# Patient Record
Sex: Female | Born: 1975 | Race: Black or African American | Hispanic: No | Marital: Single | Smoking: Never smoker
Health system: Southern US, Community
[De-identification: ages and names within clinical notes are randomized; demographics above are authoritative.]

## PROBLEM LIST (undated history)

## (undated) DIAGNOSIS — Z8719 Personal history of other diseases of the digestive system: Secondary | ICD-10-CM

## (undated) DIAGNOSIS — N840 Polyp of corpus uteri: Secondary | ICD-10-CM

## (undated) DIAGNOSIS — Z86010 Personal history of colonic polyps: Secondary | ICD-10-CM

## (undated) DIAGNOSIS — J45909 Unspecified asthma, uncomplicated: Secondary | ICD-10-CM

## (undated) DIAGNOSIS — R112 Nausea with vomiting, unspecified: Secondary | ICD-10-CM

## (undated) DIAGNOSIS — I10 Essential (primary) hypertension: Secondary | ICD-10-CM

## (undated) DIAGNOSIS — D259 Leiomyoma of uterus, unspecified: Secondary | ICD-10-CM

## (undated) DIAGNOSIS — R87612 Low grade squamous intraepithelial lesion on cytologic smear of cervix (LGSIL): Secondary | ICD-10-CM

## (undated) DIAGNOSIS — D649 Anemia, unspecified: Secondary | ICD-10-CM

## (undated) DIAGNOSIS — Z9889 Other specified postprocedural states: Secondary | ICD-10-CM

## (undated) DIAGNOSIS — R12 Heartburn: Secondary | ICD-10-CM

## (undated) HISTORY — PX: LAPAROSCOPIC GASTRIC SLEEVE RESECTION: SHX5895

## (undated) HISTORY — DX: Unspecified asthma, uncomplicated: J45.909

## (undated) HISTORY — PX: GALLBLADDER SURGERY: SHX652

## (undated) HISTORY — DX: Low grade squamous intraepithelial lesion on cytologic smear of cervix (LGSIL): R87.612

## (undated) HISTORY — PX: HERNIA REPAIR: SHX51

## (undated) HISTORY — PX: COLONOSCOPY W/ POLYPECTOMY: SHX1380

---

## 2015-10-13 ENCOUNTER — Ambulatory Visit (INDEPENDENT_AMBULATORY_CARE_PROVIDER_SITE_OTHER): Payer: BC Managed Care – PPO | Admitting: Internal Medicine

## 2015-10-13 VITALS — BP 136/80 | HR 82 | Temp 98.0°F | Resp 15 | Ht 65.0 in | Wt 140.0 lb

## 2015-10-13 DIAGNOSIS — T8332XA Displacement of intrauterine contraceptive device, initial encounter: Secondary | ICD-10-CM

## 2015-10-13 DIAGNOSIS — N939 Abnormal uterine and vaginal bleeding, unspecified: Secondary | ICD-10-CM

## 2015-10-13 DIAGNOSIS — T8389XA Other specified complication of genitourinary prosthetic devices, implants and grafts, initial encounter: Secondary | ICD-10-CM | POA: Diagnosis not present

## 2015-10-13 LAB — POCT WET + KOH PREP
TRICH BY WET PREP: ABSENT
YEAST BY KOH: ABSENT
YEAST BY WET PREP: ABSENT

## 2015-10-13 NOTE — Progress Notes (Signed)
Subjective:  This chart was scribed for Tami Lin, MD by Thea Alken, ED Scribe. This patient was seen in room 1 and the patient's care was started at 11:20 AM.   Patient ID: Kathryn Frye, female    DOB: [birth year], 39 y.o.   MRN: [id number]  HPI   Chief Complaint  Patient presents with   Vaginal Bleeding   HPI Comments: Kathryn Frye is a 39 y.o. female who presents to the [facility] complaining of vaginal bleeding. Pt noticed vaginal bleeding after intercourse last night. States she had a similar occurrence 2 weeks ago but states at that time she had just finished menstruating. She has been with current partner for a couple months but states they've just started having intercourse. LMP- 11/11. She has an IUD for ?8-10 years and reports it is almost time for it to be removed. She denies dyspareunia. .   Past Medical History  Diagnosis Date   Asthma    Not on File Prior to Admission medications   Not on File    Review of Systems  Constitutional: Negative for fever and chills.  Gastrointestinal: Negative for abdominal pain.  Genitourinary: Positive for vaginal bleeding. Negative for dysuria and dyspareunia.       Objective:   Physical Exam  Constitutional: She is oriented to person, place, and time. She appears well-developed and well-nourished. No distress.  HENT:  Head: Normocephalic and atraumatic.  Eyes: Conjunctivae and EOM are normal.  Neck: Neck supple.  Cardiovascular: Normal rate.   Pulmonary/Chest: Effort normal.  Genitourinary:  intr clear Oc yellow d/c-no IUD string--Friable with q-tip Manip NT No LQ tenderness  Musculoskeletal: Normal range of motion.  Neurological: She is alert and oriented to person, place, and time.  Skin: Skin is warm and dry.  Psychiatric: She has a normal mood and affect. Her behavior is normal.  Nursing note and vitals reviewed.  Filed Vitals:   10/13/15 1042  BP: 136/80  Pulse: 82    Temp: 98 F (36.7 C)  TempSrc: Oral  Resp: 15  Height: 5\' 5"  (1.651 m)  Weight: 140 lb (63.504 kg)  SpO2: 98%   Results for orders placed or performed in visit on 10/13/15  POCT Wet + KOH Prep  Result Value Ref Range   Yeast by KOH Absent Present, Absent   Yeast by wet prep Absent Present, Absent   WBC by wet prep Many (A) None, Few, Too numerous to count   Clue Cells Wet Prep HPF POC None None, Too numerous to count   Trich by wet prep Absent Present, Absent   Bacteria Wet Prep HPF POC Moderate (A) None, Few, Too numerous to count   Epithelial Cells By [employer] Pref ([facility]) Moderate (A) None, Few, Too numerous to count   RBC,UR,HPF,POC None None RBC/hpf   Assessment & Plan:   1. Vaginal bleeding   2. Malpositioned IUD, initial encounter [facility])     Orders Placed This Encounter  Procedures   GC/Chlamydia Probe Amp   Ambulatory referral to Gynecology    Referral Priority:  Routine    Referral Type:  Consultation    Referral Reason:  Specialty Services Required    Referred to Provider:  Terrance Mass, MD    Requested Specialty:  Gynecology    Number of Visits Requested:  1   POCT Wet + KOH Prep    By signing my name below, I, Raven Small, attest that this documentation has been prepared under the  direction and in the presence of Tami Lin, MD.  Electronically Signed: Thea Alken, ED Scribe. 10/13/2015. 11:33 AM.  I have completed the patient encounter in its entirety as documented by the scribe, with editing by me where necessary. Robert P. Laney Pastor, M.D.

## 2015-10-16 LAB — GC/CHLAMYDIA PROBE AMP
CT PROBE, AMP APTIMA: NEGATIVE
GC Probe RNA: NEGATIVE

## 2015-11-16 ENCOUNTER — Ambulatory Visit: Payer: BC Managed Care – PPO | Admitting: Women's Health

## 2015-12-06 ENCOUNTER — Encounter: Payer: Self-pay | Admitting: Women's Health

## 2015-12-06 ENCOUNTER — Other Ambulatory Visit (HOSPITAL_COMMUNITY)
Admission: RE | Admit: 2015-12-06 | Discharge: 2015-12-06 | Disposition: A | Discharge status: Home / Self Care | Payer: BC Managed Care – PPO | Source: Ambulatory Visit | Attending: Gynecology | Admitting: Gynecology

## 2015-12-06 ENCOUNTER — Ambulatory Visit (INDEPENDENT_AMBULATORY_CARE_PROVIDER_SITE_OTHER): Payer: BC Managed Care – PPO | Admitting: Women's Health

## 2015-12-06 ENCOUNTER — Other Ambulatory Visit: Payer: Self-pay

## 2015-12-06 VITALS — BP 128/78 | Ht 63.0 in | Wt 145.0 lb

## 2015-12-06 DIAGNOSIS — N920 Excessive and frequent menstruation with regular cycle: Secondary | ICD-10-CM | POA: Diagnosis not present

## 2015-12-06 DIAGNOSIS — Z13220 Encounter for screening for lipoid disorders: Secondary | ICD-10-CM

## 2015-12-06 DIAGNOSIS — Z01419 Encounter for gynecological examination (general) (routine) without abnormal findings: Secondary | ICD-10-CM | POA: Insufficient documentation

## 2015-12-06 DIAGNOSIS — Z113 Encounter for screening for infections with a predominantly sexual mode of transmission: Secondary | ICD-10-CM | POA: Diagnosis not present

## 2015-12-06 DIAGNOSIS — N760 Acute vaginitis: Secondary | ICD-10-CM | POA: Diagnosis not present

## 2015-12-06 DIAGNOSIS — A499 Bacterial infection, unspecified: Secondary | ICD-10-CM

## 2015-12-06 DIAGNOSIS — Z9884 Bariatric surgery status: Secondary | ICD-10-CM | POA: Diagnosis not present

## 2015-12-06 DIAGNOSIS — B9689 Other specified bacterial agents as the cause of diseases classified elsewhere: Secondary | ICD-10-CM

## 2015-12-06 DIAGNOSIS — Z1231 Encounter for screening mammogram for malignant neoplasm of breast: Secondary | ICD-10-CM

## 2015-12-06 LAB — CBC WITH DIFFERENTIAL/PLATELET
BASOS ABS: 0.1 10*3/uL (ref 0.0–0.1)
BASOS PCT: 1 % (ref 0–1)
EOS ABS: 0.1 10*3/uL (ref 0.0–0.7)
EOS PCT: 1 % (ref 0–5)
HEMATOCRIT: 35.6 % — AB (ref 36.0–46.0)
Hemoglobin: 11.7 g/dL — ABNORMAL LOW (ref 12.0–15.0)
LYMPHS PCT: 26 % (ref 12–46)
Lymphs Abs: 2.1 10*3/uL (ref 0.7–4.0)
MCH: 32.1 pg (ref 26.0–34.0)
MCHC: 32.9 g/dL (ref 30.0–36.0)
MCV: 97.8 fL (ref 78.0–100.0)
MONO ABS: 0.7 10*3/uL (ref 0.1–1.0)
MPV: 10.3 fL (ref 8.6–12.4)
Monocytes Relative: 9 % (ref 3–12)
Neutro Abs: 5.2 10*3/uL (ref 1.7–7.7)
Neutrophils Relative %: 63 % (ref 43–77)
PLATELETS: 418 10*3/uL — AB (ref 150–400)
RBC: 3.64 MIL/uL — AB (ref 3.87–5.11)
RDW: 13.2 % (ref 11.5–15.5)
WBC: 8.2 10*3/uL (ref 4.0–10.5)

## 2015-12-06 LAB — COMPREHENSIVE METABOLIC PANEL
ALK PHOS: 76 U/L (ref 33–115)
ALT: 9 U/L (ref 6–29)
AST: 16 U/L (ref 10–30)
Albumin: 3.9 g/dL (ref 3.6–5.1)
BILIRUBIN TOTAL: 0.4 mg/dL (ref 0.2–1.2)
BUN: 15 mg/dL (ref 7–25)
CALCIUM: 9.2 mg/dL (ref 8.6–10.2)
CO2: 25 mmol/L (ref 20–31)
Chloride: 100 mmol/L (ref 98–110)
Creat: 0.82 mg/dL (ref 0.50–1.10)
GLUCOSE: 71 mg/dL (ref 65–99)
Potassium: 4.2 mmol/L (ref 3.5–5.3)
SODIUM: 135 mmol/L (ref 135–146)
Total Protein: 7.1 g/dL (ref 6.1–8.1)

## 2015-12-06 LAB — LIPID PANEL
Cholesterol: 162 mg/dL (ref 125–200)
HDL: 82 mg/dL (ref >=46)
LDL CALC: 70 mg/dL (ref <130)
Total CHOL/HDL Ratio: 2 Ratio (ref <=5.0)
Triglycerides: 51 mg/dL (ref <150)
VLDL: 10 mg/dL (ref <30)

## 2015-12-06 LAB — WET PREP FOR TRICH, YEAST, CLUE
Trich, Wet Prep: NONE SEEN
WBC, Wet Prep HPF POC: NONE SEEN
YEAST WET PREP: NONE SEEN

## 2015-12-06 MED ORDER — METRONIDAZOLE 500 MG PO TABS: 500.0000 mg | ORAL_TABLET | Freq: Two times a day (BID) | ORAL | Status: DC

## 2015-12-06 NOTE — Patient Instructions (Addendum)
HPV (Human Papillomavirus) Vaccine--Gardasil-9:  1. Why get vaccinated? Gardasil-9 prevents human papillomavirus (HPV) types that cause many cancers, including:  cervical cancer in females,  vaginal and vulvar cancers in females,  anal cancer in females and males,  throat cancer in females and males, and  penile cancer in males. In addition, Gardasil-9 prevents HPV types that cause genital warts in both females and males. In the [place] about 12,000 women get cervical cancer every year, and about 4,000 women die from it. Gardasil-9 can prevent most of these cases of cervical cancer. Vaccination is not a substitute for cervical cancer screening. This vaccine does not protect against all HPV types that can cause cervical cancer. Women should still get regular Pap tests. HPV infection usually comes from sexual contact, and most people will become infected at some point in their life. About 14 million Americans, including teens, get infected every year. Most infections will go away and not cause serious problems. But thousands of women and men get cancer and diseases from HPV. 2. HPV vaccine Gardasil-9 is an FDA-approved HPV vaccine. It is recommended for both males and females. It is routinely given at 11 or 42 years of age, but it may be given beginning at age 9 years through age 26 years. Three doses of Gardasil-9 are recommended with the second dose given 1-2 months after the first dose and the third dose given 6 months after the first dose. 3. Some people should not get this vaccine  Anyone who has had a severe, life-threatening allergic reaction to a dose of HPV vaccine should not get another dose.  Anyone who has a severe (life threatening) allergy to any component of HPV vaccine should not get the vaccine. Tell your doctor if you have any severe allergies that you know of, including a severe allergy to yeast.  HPV vaccine is not recommended for pregnant women. If you learn that you were  pregnant when you were vaccinated, there is no reason to expect any problems for you or your baby. Any woman who learns she was pregnant when she got Gardasil-9 vaccine is encouraged to contact the manufacturer's registry for HPV vaccination during pregnancy at 1-800-986-8999. Women who are breastfeeding may be vaccinated.  If you have a mild illness, such as a cold, you can probably get the vaccine today. If you are moderately or severely ill, you should probably wait until you recover. Your doctor can advise you. 4. Risks of a vaccine reaction With any medicine, including vaccines, there is a chance of side effects. These are usually mild and go away on their own, but serious reactions are also possible. Most people who get HPV vaccine do not have any serious problems with it. Mild or moderate problems following Gardasil-9:  Reactions in the arm where the shot was given:  Soreness (about 9 people in 10)  Redness or swelling (about 1 person in 3)  Fever:  Mild (100F) (about 1 person in 10)  Moderate (102F) (about 1 person in 65)  Other problems:  Headache (about 1 person in 3) Problems that could happen after any injected vaccine:  People sometimes faint after a medical procedure, including vaccination. Sitting or lying down for about 15 minutes can help prevent fainting, and injuries caused by a fall. Tell your doctor if you feel dizzy, or have vision changes or ringing in the ears.  Some people get severe pain in the shoulder and have difficulty moving the arm where a shot was given. This happens   very rarely.  Any medication can cause a severe allergic reaction. Such reactions from a vaccine are very rare, estimated at about 1 in a million doses, and would happen within a few minutes to a few hours after the vaccination. As with any medicine, there is a very remote chance of a vaccine causing a serious injury or death. The safety of vaccines is always being monitored. For more  information, visit: http://www.aguilar.org/. 5. What if there is a serious reaction? What should I look for? Look for anything that concerns you, such as signs of a severe allergic reaction, very high fever, or unusual behavior. Signs of a severe allergic reaction can include hives, swelling of the face and throat, difficulty breathing, a fast heartbeat, dizziness, and weakness. These would usually start a few minutes to a few hours after the vaccination. What should I do? If you think it is a severe allergic reaction or other emergency that can't wait, call 9-1-1 or get to the nearest hospital. Otherwise, call your doctor. Afterward, the reaction should be reported to the "Vaccine Adverse Event Reporting System" (VAERS). Your doctor might file this report, or you can do it yourself through the VAERS web site at www.vaers.SamedayNews.es, or by calling 463 128 4199. VAERS does not give medical advice. 6. The National Vaccine Injury Compensation Program The [employer] Vaccine Injury Compensation Program (VICP) is a federal program that was created to compensate people who may have been injured by certain vaccines. Persons who believe they may have been injured by a vaccine can learn about the program and about filing a claim by calling 5632428544 or visiting the [place] website at GoldCloset.com.ee. There is a time limit to file a claim for compensation. 7. How can I learn more?  Ask your health care provider. He or she can give you the vaccine package insert or suggest other sources of information.  Call your local or state health department.  Contact the Centers for Disease Control and Prevention (CDC):  Call 8630385372 (1-800-CDC-INFO) or  Visit CDC's website at http://sweeney-todd.com/ Vaccine Information Statement HPV Vaccine Gaylyn Lambert) 02/15/15   This information is not intended to replace advice given to you by your health care provider. Make sure you discuss any questions you  have with your health care provider.   Document Released: 05/31/2014 Document Revised: 03/20/2015 Document Reviewed: 05/31/2014 Elsevier Interactive Patient Education 2016 Elsevier Inc. Bacterial Vaginosis Bacterial vaginosis is a vaginal infection that occurs when the normal balance of bacteria in the vagina is disrupted. It results from an overgrowth of certain bacteria. This is the most common vaginal infection in women of childbearing age. Treatment is important to prevent complications, especially in pregnant women, as it can cause a premature delivery. CAUSES  Bacterial vaginosis is caused by an increase in harmful bacteria that are normally present in smaller amounts in the vagina. Several different kinds of bacteria can cause bacterial vaginosis. However, the reason that the condition develops is not fully understood. RISK FACTORS Certain activities or behaviors can put you at an increased risk of developing bacterial vaginosis, including:  Having a new sex partner or multiple sex partners.  Douching.  Using an intrauterine device (IUD) for contraception. Women do not get bacterial vaginosis from toilet seats, bedding, swimming pools, or contact with objects around them. SIGNS AND SYMPTOMS  Some women with bacterial vaginosis have no signs or symptoms. Common symptoms include:  Grey vaginal discharge.  A fishlike odor with discharge, especially after sexual intercourse.  Itching or burning of the vagina  and vulva.  Burning or pain with urination. DIAGNOSIS  Your health care provider will take a medical history and examine the vagina for signs of bacterial vaginosis. A sample of vaginal fluid may be taken. Your health care provider will look at this sample under a microscope to check for bacteria and abnormal cells. A vaginal pH test may also be done.  TREATMENT  Bacterial vaginosis may be treated with antibiotic medicines. These may be given in the form of a pill or a vaginal  cream. A second round of antibiotics may be prescribed if the condition comes back after treatment. Because bacterial vaginosis increases your risk for sexually transmitted diseases, getting treated can help reduce your risk for chlamydia, gonorrhea, HIV, and herpes. HOME CARE INSTRUCTIONS   Only take over-the-counter or prescription medicines as directed by your health care provider.  If antibiotic medicine was prescribed, take it as directed. Make sure you finish it even if you start to feel better.  Tell all sexual partners that you have a vaginal infection. They should see their health care provider and be treated if they have problems, such as a mild rash or itching.  During treatment, it is important that you follow these instructions:  Avoid sexual activity or use condoms correctly.  Do not douche.  Avoid alcohol as directed by your health care provider.  Avoid breastfeeding as directed by your health care provider. SEEK MEDICAL CARE IF:   Your symptoms are not improving after 3 days of treatment.  You have increased discharge or pain.  You have a fever. MAKE SURE YOU:   Understand these instructions.  Will watch your condition.  Will get help right away if you are not doing well or get worse. FOR MORE INFORMATION  Centers for Disease Control and Prevention, Division of STD Prevention: AppraiserFraud.fi American Sexual Health Association (ASHA): www.ashastd.org    This information is not intended to replace advice given to you by your health care provider. Make sure you discuss any questions you have with your health care provider.   Document Released: 11/03/2005 Document Revised: 11/24/2014 Document Reviewed: 06/15/2013 Elsevier Interactive Patient Education [employer].

## 2015-12-06 NOTE — Progress Notes (Signed)
Kathryn Frye 01-26-1976 [id number]    History:    Presents for annual exam.  Monthly 7 - 9 day cycle with ParaGard IUD placed  8-10 years in [place]. Normal Pap history. Has not had a mammogram. Negative GC/chlamydia 09/2015 at primary care same partner. Was seen at primary care in November for irregular bleeding with intercourse.  Past medical history, past surgical history, family history and social history were all reviewed and documented in the EPIC chart. First grade teacher. Has a 40 year old son and 3 adopted children ages 40-12. 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 History of cholecystectomy and abdominal hernia repair  ROS:  A ROS was performed and pertinent positives and negatives are included.  Exam:  Filed Vitals:   12/06/15 1215  BP: 128/78    General appearance:  Normal Thyroid:  Symmetrical, normal in size, without palpable masses or nodularity. Respiratory  Auscultation:  Clear without wheezing or rhonchi Cardiovascular  Auscultation:  Regular rate, without rubs, murmurs or gallops  Edema/varicosities:  Not grossly evident Abdominal  Soft,nontender, without masses, guarding or rebound.  Liver/spleen:  No organomegaly noted  Hernia:  None appreciated  Skin  Inspection:  Grossly normal   Breasts: Examined lying and sitting.     Right: Without masses, retractions, discharge or axillary adenopathy.     Left: Without masses, retractions, discharge or axillary adenopathy. Gentitourinary   Inguinal/mons:  Normal without inguinal adenopathy  External genitalia:  Normal  BUS/Urethra/Skene's glands:  Normal  Vagina:  Normal cervix friable, wet prep positive for moderate amount of clues, TNTC bacteria, positive odor  Cervix:  Normal IUD strings not visible  Uterus:  normal in size, shape and contour.  Midline and mobile  Adnexa/parametria:     Rt: Without masses or tenderness.   Lt: Without masses or tenderness.  Anus and  perineum: Normal  Digital rectal exam: Normal sphincter tone without palpated masses or tenderness  Assessment/Plan:  40 y.o.  SBF G1 P1 for annual exam.     Monthly 7- 9 day cycle ParaGard IUD due to be removed Contraception management Bacteria vaginosis  Plan: Flagyl 500 twice daily for 7 days, alcohol precautions reviewed. Contraception options reviewed, Mirena IUD information given and reviewed slight risk for infection, perforation, hemorrhage. Will check coverage, Dr. Phineas Real to place with next cycle. SBE's, reviewed importance of annual screening mammogram, instructed to schedule, breast center information given. Exercise, calcium rich diet, vitamin D 1000 daily encouraged. CBC, CMP, lipid panel, TSH, UA, Pap with HR HPV typing, HIV, hep B, C, RPR.    Kathryn Frye J WHNP, 1:40 PM 12/06/2015

## 2015-12-07 LAB — HIV ANTIBODY (ROUTINE TESTING W REFLEX): HIV: NONREACTIVE

## 2015-12-07 LAB — TSH: TSH: 0.958 u[IU]/mL (ref 0.350–4.500)

## 2015-12-07 LAB — URINALYSIS W MICROSCOPIC + REFLEX CULTURE
Bilirubin Urine: NEGATIVE
CASTS: NONE SEEN [LPF]
Crystals: NONE SEEN [HPF]
Glucose, UA: NEGATIVE
Ketones, ur: NEGATIVE
Leukocytes, UA: NEGATIVE
NITRITE: NEGATIVE
PROTEIN: NEGATIVE
Specific Gravity, Urine: 1.023 (ref 1.001–1.035)
YEAST: NONE SEEN [HPF]
pH: 6 (ref 5.0–8.0)

## 2015-12-07 LAB — RPR

## 2015-12-07 LAB — HEPATITIS C ANTIBODY: HCV Ab: NEGATIVE

## 2015-12-07 LAB — HEPATITIS B SURFACE ANTIGEN: HEP B S AG: NEGATIVE

## 2015-12-09 LAB — URINE CULTURE: Colony Count: >=100000

## 2015-12-10 ENCOUNTER — Other Ambulatory Visit: Payer: Self-pay | Admitting: Women's Health

## 2015-12-10 MED ORDER — SULFAMETHOXAZOLE-TRIMETHOPRIM 800-160 MG PO TABS: 1.0000 | ORAL_TABLET | Freq: Two times a day (BID) | ORAL | Status: DC

## 2015-12-11 ENCOUNTER — Telehealth: Payer: Self-pay | Admitting: Gynecology

## 2015-12-11 LAB — CYTOLOGY - PAP

## 2015-12-11 NOTE — Telephone Encounter (Signed)
12/11/15-I spoke w/pt today to let her know that her [facility] will cover the removal of paraguard iud and insertion of new Mirena iud for contraception at 100%, no copay. She will call for appt with next cycle. This is [place] pt. Pr [id number]--Ref#[id number]

## 2015-12-13 ENCOUNTER — Encounter: Payer: Self-pay | Admitting: Gynecology

## 2015-12-13 ENCOUNTER — Ambulatory Visit (INDEPENDENT_AMBULATORY_CARE_PROVIDER_SITE_OTHER): Payer: BC Managed Care – PPO | Admitting: Gynecology

## 2015-12-13 VITALS — BP 116/74

## 2015-12-13 DIAGNOSIS — Z30433 Encounter for removal and reinsertion of intrauterine contraceptive device: Secondary | ICD-10-CM | POA: Diagnosis not present

## 2015-12-13 NOTE — Progress Notes (Signed)
Patient presents for ParaGard IUD removal and Mirena IUD replacement. She has read through the booklet, has no contraindications and signed the consent form.  She is having heavy menses with the ParaGard and wants to try the Mirena.  At the last 2 pelvic exams her IUD strings were not visible.  She currently is on a normal menses.  I reviewed the removal and insertional process with her as well as the risks to include infection, either immediate or long-term, uterine perforation or migration requiring surgery to remove, other complications such as pain, hormonal side effects, infertility and possibility of failure with subsequent pregnancy.   Exam with [name] assistant Pelvic: External BUS vagina normal. Cervix normal with moderate menses flow. IUD strings not visualized. Uterus anteverted normal size shape contour midline mobile nontender. Adnexa without masses or tenderness.  Procedure: The cervix was visualized with the speculum and the cervical os was initially probed with a [place] forcep and no IUD strings were visualized or grasped. Using an IUD hook inserted into the uterine cavity I could feel the IUD but was unable to retrieve it in 2 passes. I subsequently used the [facility] forcep, advanced into the lower uterine segment and was able to grab the ParaGard IUD at the lower aspect and remove the IUD intact. Of note there were no strings attached to the IUD.  I passed the [facility] forcep back into the uterine cavity opening and closing it several times noting no retrieval of any strings.  The old ParaGard was shown to the patient and discarded.  The cervix was subsequently cleansed with Betadine, anterior lip grasped with a single-tooth tenaculum, the uterus was sounded and a Mirena IUD was placed according to manufacturer's recommendations without difficulty. The strings were trimmed.  I questioned her as far as when the last time anybody saw the strings and she had not been examined for several  years and does not remember anybody talking about the strings in the past. I reviewed with her that I never grabbed onto the strings to have pulled them loose and when I retrieve the IUD I grasped it at the lower pole away from the string insertion. My only speculation was either they spontaneously came loose and extruded or possibly someone else had tried to remove the IUD at some point and the strings broke.  I reviewed with the patient whether at this point anything further needed to be done and I think given her situation with a new IUD that really nothing further needs to be done. Childbearing is no longer an issue as she desires no further pregnancies.  The patient will follow up in one month for a postinsertional check.     Lot number:  [id number]    Anastasio Auerbach MD, 4:30 PM 12/13/2015

## 2015-12-13 NOTE — Patient Instructions (Signed)
Follow up in one month for IUD check.  Intrauterine Device Insertion Most often, an intrauterine device (IUD) is inserted into the uterus to prevent pregnancy. There are 2 types of IUDs available:  Copper IUD--This type of IUD creates an environment that is not favorable to sperm survival. The mechanism of action of the copper IUD is not known for certain. It can stay in place for 10 years.  Hormone IUD--This type of IUD contains the hormone progestin (synthetic progesterone). The progestin thickens the cervical mucus and prevents sperm from entering the uterus, and it also thins the uterine lining. There is no evidence that the hormone IUD prevents implantation. One hormone IUD can stay in place for up to 5 years, and a different hormone IUD can stay in place for up to 3 years. An IUD is the most cost-effective birth control if left in place for the full duration. It may be removed at any time. LET [facility] CARE PROVIDER KNOW ABOUT:  Any allergies you have.  All medicines you are taking, including vitamins, herbs, eye drops, creams, and over-the-counter medicines.  Previous problems you or members of your family have had with the use of anesthetics.  Any blood disorders you have.  Previous surgeries you have had.  Possibility of pregnancy.  Medical conditions you have. RISKS AND COMPLICATIONS  Generally, intrauterine device insertion is a safe procedure. However, as with any procedure, complications can occur. Possible complications include:  Accidental puncture (perforation) of the uterus.  Accidental placement of the IUD either in the muscle layer of the uterus (myometrium) or outside the uterus. If this happens, the IUD can be found essentially floating around the bowels and must be taken out surgically.  The IUD may fall out of the uterus (expulsion). This is more common in women who have recently had a child.   Pregnancy in the fallopian tube (ectopic).  Pelvic  inflammatory disease (PID), which is infection of the uterus and fallopian tubes. The risk of PID is slightly increased in the first 20 days after the IUD is placed, but the overall risk is still very low. BEFORE THE PROCEDURE  Schedule the IUD insertion for when you will have your menstrual period or right after, to make sure you are not pregnant. Placement of the IUD is better tolerated shortly after a menstrual cycle.  You may need to take tests or be examined to make sure you are not pregnant.  You may be required to take a pregnancy test.  You may be required to get checked for sexually transmitted infections (STIs) prior to placement. Placing an IUD in someone who has an infection can make the infection worse.  You may be given a pain reliever to take 1 or 2 hours before the procedure.  An exam will be performed to determine the size and position of your uterus.  Ask your health care provider about changing or stopping your regular medicines. PROCEDURE   A tool (speculum) is placed in the vagina. This allows your health care provider to see the lower part of the uterus (cervix).  The cervix is prepped with a medicine that lowers the risk of infection.  You may be given a medicine to numb each side of the cervix (intracervical or paracervical block). This is used to block and control any discomfort with insertion.  A tool (uterine sound) is inserted into the uterus to determine the length of the uterine cavity and the direction the uterus may be tilted.  A  slim instrument (IUD inserter) is inserted through the cervical canal and into your uterus.  The IUD is placed in the uterine cavity and the insertion device is removed.  The nylon string that is attached to the IUD and used for eventual IUD removal is trimmed. It is trimmed so that it lays high in the vagina, just outside the cervix. AFTER THE PROCEDURE  You may have bleeding after the procedure. This is normal. It varies  from light spotting for a few days to menstrual-like bleeding.  You may have mild cramping.   This information is not intended to replace advice given to you by your health care provider. Make sure you discuss any questions you have with your health care provider.   Document Released: 07/02/2011 Document Revised: 08/24/2013 Document Reviewed: 04/24/2013 Elsevier Interactive Patient Education [employer].

## 2015-12-14 ENCOUNTER — Encounter: Payer: Self-pay | Admitting: Gynecology

## 2015-12-17 ENCOUNTER — Encounter: Payer: Self-pay | Admitting: Gynecology

## 2015-12-26 ENCOUNTER — Ambulatory Visit
Admission: RE | Admit: 2015-12-26 | Discharge: 2015-12-26 | Disposition: A | Discharge status: Home / Self Care | Payer: BC Managed Care – PPO | Source: Ambulatory Visit

## 2015-12-26 DIAGNOSIS — Z1231 Encounter for screening mammogram for malignant neoplasm of breast: Secondary | ICD-10-CM

## 2016-01-18 ENCOUNTER — Ambulatory Visit (INDEPENDENT_AMBULATORY_CARE_PROVIDER_SITE_OTHER): Payer: BC Managed Care – PPO | Admitting: Gynecology

## 2016-01-18 ENCOUNTER — Encounter: Payer: Self-pay | Admitting: Gynecology

## 2016-01-18 VITALS — BP 120/74

## 2016-01-18 DIAGNOSIS — Z30431 Encounter for routine checking of intrauterine contraceptive device: Secondary | ICD-10-CM | POA: Diagnosis not present

## 2016-01-18 NOTE — Progress Notes (Signed)
    Zamya Kreuser August 25, 1976 [id number]        40 y.o.  G1P0011 Presents for IUD follow up exam.  Had her Mirena IUD replaced 12/13/2015. Doing well without complaints.  Past medical history,surgical history, problem list, medications, allergies, family history and social history were all reviewed and documented in the EPIC chart.  Directed ROS with pertinent positives and negatives documented in the history of present illness/assessment and plan.  Exam: [name] assistant Filed Vitals:   01/18/16 1606  BP: 120/74   General appearance:  Normal Abdomen soft nontender without masses guarding rebound External BUS vagina with light menses flow. Cervix normal with IUD string visualized an appropriate length. Uterus normal size midline mobile nontender. Adnexa without masses or tenderness  Assessment/Plan:  40 y.o. G1P0011 with normal IUD check up exam. Patient will follow up in one year for her annual exam, sooner if any issues.    Anastasio Auerbach MD, 4:13 PM 01/18/2016

## 2016-01-18 NOTE — Patient Instructions (Signed)
Follow up in one year for your annual exam, sooner if any issues. 

## 2016-06-13 ENCOUNTER — Encounter: Payer: Self-pay | Admitting: Gynecology

## 2016-06-13 ENCOUNTER — Ambulatory Visit (INDEPENDENT_AMBULATORY_CARE_PROVIDER_SITE_OTHER): Payer: BC Managed Care – PPO | Admitting: Gynecology

## 2016-06-13 VITALS — BP 120/74

## 2016-06-13 DIAGNOSIS — T8389XA Other specified complication of genitourinary prosthetic devices, implants and grafts, initial encounter: Secondary | ICD-10-CM

## 2016-06-13 NOTE — Patient Instructions (Signed)
Follow up for ultrasound as scheduled 

## 2016-06-13 NOTE — Progress Notes (Signed)
    Kathryn Frye Sep 16, 1976 [id number]        40 y.o.  G1P0011 presents having spontaneously extruded her Mirena IUD with her menses. Had IUD placed 11/2015. Continue with regular monthly menses. Past clot last night which included her Mirena IUD. No history of leiomyoma or other issues in the past.   Past medical history,surgical history, problem list, medications, allergies, family history and social history were all reviewed and documented in the EPIC chart.  Directed ROS with pertinent positives and negatives documented in the history of present illness/assessment and plan.  Exam: [name]  assistant Vitals:   06/13/16 0828  BP: 120/74   General appearance:  Normal Abdomen soft nontender without masses guarding rebound Pelvic external BUS vagina with white menses flow. Cervix normal. Uterus grossly normal size somewhat nodular with questionable leiomyoma. Adnexa without masses or tenderness   Assessment/Plan:  40 y.o. G1P0011 with spontaneous extrusion of her Mirena IUD. She brought it with her and I inspected it and shows no abnormalities grossly. Uterus somewhat nodular questionable leiomyoma. We'll start with ultrasound for better definition. Also have sonohysterogram available with questionable leiomyoma will assess uterine cavity contour. We'll consider replacement of her Mirena IUD if all looks normal. Need for backup contraception now reviewed. Patient will follow up for her ultrasound and we will go from there.   Anastasio Auerbach MD, 8:43 AM 06/13/2016

## 2016-08-08 ENCOUNTER — Other Ambulatory Visit: Payer: Self-pay | Admitting: Gynecology

## 2016-08-08 DIAGNOSIS — T8389XA Other specified complication of genitourinary prosthetic devices, implants and grafts, initial encounter: Secondary | ICD-10-CM

## 2016-08-13 ENCOUNTER — Other Ambulatory Visit: Payer: Self-pay | Admitting: Gynecology

## 2016-08-13 ENCOUNTER — Ambulatory Visit (INDEPENDENT_AMBULATORY_CARE_PROVIDER_SITE_OTHER): Payer: BC Managed Care – PPO

## 2016-08-13 ENCOUNTER — Ambulatory Visit (INDEPENDENT_AMBULATORY_CARE_PROVIDER_SITE_OTHER): Payer: BC Managed Care – PPO | Admitting: Gynecology

## 2016-08-13 ENCOUNTER — Encounter: Payer: Self-pay | Admitting: Gynecology

## 2016-08-13 VITALS — BP 120/76

## 2016-08-13 DIAGNOSIS — D251 Intramural leiomyoma of uterus: Secondary | ICD-10-CM

## 2016-08-13 DIAGNOSIS — D250 Submucous leiomyoma of uterus: Secondary | ICD-10-CM

## 2016-08-13 DIAGNOSIS — N9489 Other specified conditions associated with female genital organs and menstrual cycle: Secondary | ICD-10-CM

## 2016-08-13 DIAGNOSIS — T8389XA Other specified complication of genitourinary prosthetic devices, implants and grafts, initial encounter: Secondary | ICD-10-CM

## 2016-08-13 NOTE — Progress Notes (Signed)
    Kathryn Frye 05-Jan-1976 [id number]        40 y.o.  G1P0011 presents for sonohysterogram.  History of spontaneous expulsion of her IUD with uterus palpated irregular with questionable fibroids.  Past medical history,surgical history, problem list, medications, allergies, family history and social history were all reviewed and documented in the EPIC chart.  Directed ROS with pertinent positives and negatives documented in the history of present illness/assessment and plan.  Exam: [place] assistant Vitals:   08/13/16 0852  BP: 120/76   General appearance:  Normal Abdomen soft nontender without masses guarding rebound Pelvic external BUS vagina normal. Cervix normal. Uterus irregular. Adnexa without masses or tenderness  Ultrasound shows uterus generous in size with multiple myomas measuring 33 mm, 25 mm, 23 mm, 17 mm, 15 mm, 12 mm. Some distortion of the endometrial echo. Endometrial echo measures 6.1 mm. Right and left ovaries normal. Cul-de-sac negative.  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 Approximately 50% within the cavity. Endometrial sample taken. Patient tolerated well.  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 I suspect the difficulty I had removing her ParaGard was that the submucous myoma grew over years and obstructed the ParaGard. I think it also responsible for the spontaneous expulsion of her replaced Mirena. Options for management reviewed with her to include observation with alternative birth control such as oral contraceptives or possibly Nexplanon. Tubal sterilization. Hysteroscopic resection of the submucous component with attempted replacement of Mirena afterwards. Tubal sterilization with submucous myoma resection. She notes with the ParaGard her menses were long although not  particularly heavy. Patient will follow up for biopsy results and will rediscuss where we want to go from here.    Anastasio Auerbach MD, 9:04 AM 08/13/2016

## 2016-08-13 NOTE — Patient Instructions (Signed)
Office will call you with biopsy results. At that time we will discuss where we want to go as far as birth control.

## 2016-08-21 ENCOUNTER — Telehealth: Payer: Self-pay

## 2016-08-21 NOTE — Telephone Encounter (Signed)
I spoke with patient and relayed Dr. Dorette Grate recommendation.  Patient does want to proceed with Hysteroscopy, D&C. I briefly explained what is involved with procedure, outpatient, someone to drive her home and be with her after surgery, etc.  We scheduled her for 10/07/16 7:30am as this worked best with her finances. We did discuss her ins benefits and est [facility] surgery prepayment.  Our [facility] desk will call her to schedule pre op visit with Dr. Loetta Rough.  Dr. Loetta Rough- Just need surgery order from you. She knows I may call her back if any additional info from you on order. Thanks

## 2016-08-21 NOTE — Telephone Encounter (Signed)
-----   Message from Anastasio Auerbach, MD sent at 08/18/2016  9:30 AM EDT ----- Tell patient the biopsy showed evidence of an endometrial polyp. Given the distortion of the cavity on the ultrasound my recommendation would be to proceed with a hysteroscopy D&C and  intracavitary abnormalities. She could either proceed with scheduling this with Juliann Pulse if she is comfortable doing that and then see me for preoperative office visit we will go over the procedure or she can come in now and will talk about it before scheduling. Let me know how she wants to proceed

## 2016-09-18 ENCOUNTER — Encounter: Payer: Self-pay | Admitting: Gynecology

## 2016-09-18 ENCOUNTER — Telehealth: Payer: Self-pay

## 2016-09-18 NOTE — Telephone Encounter (Signed)
Left message for patient to call me. I need to explain need for Cytotec 200 mcg tab hs before surgery and send Rx in for her.

## 2016-09-19 NOTE — Telephone Encounter (Signed)
Patient called back. She left message stating she wants to reschedule her surgery. I called her back and left message to cal me back.

## 2016-09-19 NOTE — Telephone Encounter (Signed)
I spoke with patient. She wants to postpone her surgery until January due to financial reasons.  I cancelled her Nov surgery and will be in touch with her as soon as I receive the January schedule.

## 2016-09-26 ENCOUNTER — Inpatient Hospital Stay (HOSPITAL_COMMUNITY): Admission: RE | Admit: 2016-09-26 | Payer: BC Managed Care – PPO | Source: Ambulatory Visit

## 2016-09-30 ENCOUNTER — Ambulatory Visit: Payer: BC Managed Care – PPO | Admitting: Gynecology

## 2016-10-07 ENCOUNTER — Ambulatory Visit: Admit: 2016-10-07 | Payer: BC Managed Care – PPO | Admitting: Gynecology

## 2016-10-07 SURGERY — DILATATION & CURETTAGE/HYSTEROSCOPY WITH MYOSURE
Anesthesia: General

## 2016-10-20 ENCOUNTER — Telehealth: Payer: Self-pay

## 2016-10-20 NOTE — Telephone Encounter (Signed)
I left message in voice mail that I have January schedule available now. I asked her to call me if she wanted to schedule for Jan or whenever she was ready to schedule.

## 2016-11-14 ENCOUNTER — Telehealth: Payer: Self-pay

## 2016-11-14 MED ORDER — MISOPROSTOL 200 MCG PO TABS: ORAL_TABLET | ORAL | 0 refills | Status: DC

## 2016-11-14 NOTE — Patient Instructions (Addendum)
Your procedure is scheduled on:  Tuesday, Jan. 16, 2018  Enter through the [employer] [facility] at:  6:00 AM  Pick up the phone at the desk and dial [phone].  Call this number if you have problems the morning of surgery: [phone].  Remember: Do NOT eat food or drink after:  Midnight Monday. Jan. 15, 2018  Take these medicines the morning of surgery with a SIP OF WATER:  None  Stop ALL herbal medications at this time   Do NOT wear jewelry (body piercing), metal hair clips/bobby pins, make-up, or nail polish. Do NOT wear lotions, powders, or perfumes.  You may wear deodorant. Do NOT shave for 48 hours prior to surgery. Do NOT bring valuables to the hospital. Contacts, dentures, or bridgework may not be worn into surgery.  Have a responsible adult drive you home and stay with you for 24 hours after your procedure

## 2016-11-14 NOTE — Telephone Encounter (Signed)
Patient called to let me know she is ready to schedule surgery.  We had it scheduled previously in November but she had to cancel.  I will schedule her for 12/02/16 as requested. Rosemarie Ax will call her to schedule pre op appt. I advised her regarding need for Cytotec Tab hs before surgery. I mentioned to her that she may have some menstrual like cramping of light bleeding/spotting the night she uses it but no worries. Just means it is working.  Rx sent.

## 2016-11-18 ENCOUNTER — Encounter (HOSPITAL_COMMUNITY): Payer: Self-pay

## 2016-11-18 ENCOUNTER — Encounter (HOSPITAL_COMMUNITY)
Admission: RE | Admit: 2016-11-18 | Discharge: 2016-11-18 | Disposition: A | Discharge status: Home / Self Care | Payer: BC Managed Care – PPO | Source: Ambulatory Visit | Attending: Gynecology | Admitting: Gynecology

## 2016-11-18 DIAGNOSIS — Z01812 Encounter for preprocedural laboratory examination: Secondary | ICD-10-CM | POA: Insufficient documentation

## 2016-11-18 HISTORY — DX: Anemia, unspecified: D64.9

## 2016-11-18 HISTORY — DX: Heartburn: R12

## 2016-11-18 LAB — CBC
HCT: 29.6 % — ABNORMAL LOW (ref 36.0–46.0)
Hemoglobin: 9.9 g/dL — ABNORMAL LOW (ref 12.0–15.0)
MCH: 30.1 pg (ref 26.0–34.0)
MCHC: 33.4 g/dL (ref 30.0–36.0)
MCV: 90 fL (ref 78.0–100.0)
PLATELETS: 369 10*3/uL (ref 150–400)
RBC: 3.29 MIL/uL — AB (ref 3.87–5.11)
RDW: 14.6 % (ref 11.5–15.5)
WBC: 7.6 10*3/uL (ref 4.0–10.5)

## 2016-11-27 ENCOUNTER — Encounter: Payer: BC Managed Care – PPO | Admitting: Gynecology

## 2016-11-28 ENCOUNTER — Telehealth: Payer: Self-pay

## 2016-11-28 ENCOUNTER — Other Ambulatory Visit: Payer: Self-pay

## 2016-11-28 NOTE — Telephone Encounter (Signed)
Patient showed for pre op appt in the office yesterday.  She was not able to pay her surgery prepayment as she had tuition for school due.  She will reschedule surgery until she can make financial arrangements. She said she will call me when she is ready.

## 2016-11-28 NOTE — Progress Notes (Signed)
This encounter was created in error - please disregard.

## 2016-12-02 ENCOUNTER — Ambulatory Visit (HOSPITAL_COMMUNITY): Admission: RE | Admit: 2016-12-02 | Payer: BC Managed Care – PPO | Source: Ambulatory Visit | Admitting: Gynecology

## 2016-12-02 ENCOUNTER — Encounter (HOSPITAL_COMMUNITY): Admission: RE | Payer: Self-pay | Source: Ambulatory Visit

## 2016-12-02 SURGERY — DILATATION & CURETTAGE/HYSTEROSCOPY WITH MYOSURE
Anesthesia: General

## 2016-12-10 ENCOUNTER — Encounter: Payer: BC Managed Care – PPO | Admitting: Women's Health

## 2016-12-10 DIAGNOSIS — Z0289 Encounter for other administrative examinations: Secondary | ICD-10-CM

## 2017-02-25 ENCOUNTER — Ambulatory Visit (INDEPENDENT_AMBULATORY_CARE_PROVIDER_SITE_OTHER): Payer: BC Managed Care – PPO | Admitting: Physician Assistant

## 2017-02-25 DIAGNOSIS — R030 Elevated blood-pressure reading, without diagnosis of hypertension: Secondary | ICD-10-CM | POA: Diagnosis not present

## 2017-02-25 NOTE — Patient Instructions (Addendum)
You may proceed with the dental procedure. If your blood pressure remains elevated at your follow-up visit in 2 weeks, I will recommend restarting medication to lower it.    IF you received an x-ray today, you will receive an invoice from [facility]. Please contact [facility] at [phone] with questions or concerns regarding your invoice.   IF you received labwork today, you will receive an invoice from [facility]. Please contact [facility] at [phone] with questions or concerns regarding your invoice.   Our billing staff will not be able to assist you with questions regarding bills from these companies.  You will be contacted with the lab results as soon as they are available. The fastest way to get your results is to activate your My Chart account. Instructions are located on the last page of this paperwork. If you have not heard from [place] regarding the results in 2 weeks, please contact this office.

## 2017-02-25 NOTE — Progress Notes (Signed)
THIS NOTE IS USED FOR EDUCATIONAL PURPOSES ONLY!!!   Name: Kathryn Frye  DOB: [birth year]  Age: 41 y.o. Sex: female  CC:  Chief Complaint  Patient presents with  . Hypertension    PCP: Reginia Forts, MD  HPI: Patient reports today for complaints of high blood pressure. She reports a history of HTN prior to getting bariatric surgery in 2014. She does not recall what medications she was on previous to her surgery. She reports that she moved here from [place] 3 years ago and has not seen a primary care provider since. She reports that yesterday she went to a [facility] for a tooth abscess for a pre-op visit and found her blood pressure to be in the 160/115. She denies any HA, vision changes in the previous 3 years. She denies history of anxiety. Denies CP, SOB, palpitations, tachycardia.   Previous GYN exam in September of 2017 showed a BP of 120/76. Prior her BP was 120/74 in July of 2017.   ROS:  Constitutional: Negative for activity change, appetite change, fatigue and unexpected weight change.  HENT: Negative for congestion, dental problem, ear pain, hearing loss, mouth sores, postnasal drip, rhinorrhea, sneezing, sore throat, tinnitus and trouble swallowing.   Eyes: Negative for photophobia, pain, redness and visual disturbance.  Respiratory: Negative for cough, chest tightness and shortness of breath.   Cardiovascular: Negative for chest pain, palpitations and leg swelling.  Gastrointestinal: Negative for abdominal pain, blood in stool, constipation, diarrhea, nausea and vomiting.  Genitourinary: Negative for dysuria, frequency, hematuria and urgency.  Musculoskeletal: Negative for arthralgias, gait problem, myalgias and neck stiffness.  Skin: Negative for rash.  Neurological: Negative for dizziness, speech difficulty, weakness, light-headedness, numbness and headaches.  Hematological: Negative for adenopathy.  Psychiatric/Behavioral: Negative for confusion and sleep  disturbance. The patient is not nervous/anxious.    PMH:  Patient Active Problem List   Diagnosis Date Noted  . H/O bariatric surgery 12/06/2015    Allergies:  Allergies  Allergen Reactions  . Shellfish Allergy Itching, Swelling and Rash    Itching , rash and throat swelling , no epi pen . No breathing problems never eats shrimp now    Medications:  Current Outpatient Prescriptions on File Prior to Visit  Medication Sig Dispense Refill  . misoprostol (CYTOTEC) 200 MCG tablet Insert tab VAGINALLY hs before surgery. 1 tablet 0   No current facility-administered medications on file prior to visit.     PE:  GS: WDWN female sitting on exam table in NAD.  Vitals: BP (!) 164/106   Pulse 81   Temp 98 F (36.7 C) (Oral)   Resp 18   Ht 5\' 3"  (1.6 m)   Wt 155 lb (70.3 kg)   LMP 01/29/2017 (Approximate)   SpO2 100%   BMI 27.46 kg/m  HEENT: Normocephalic, atruamatic. PEARRL. No cervical lymphadenopathy. No thyroid nodules, normal size, and equal bilaterally.  Cardiovascular: RRR. No S3 or S4. No murmurs, rubs, or gallops. Pulses 2+ and equal bilateral in the upper and lower extremities. No pitting edema. No varicosities, clubbing, or cyanosis.  Pulm: CTA bilaterally. No expiratory muscle use while breathing.  GI: +BS. NTND. No rigidity or guarding. No rebound tenderness.  Neuro: CN 2-12 grossly intact.  Psych: A&O x 4. Mood and affect appropriate for situation.  Skin: Warm and dry. No rashes or excoriations on exposed skin.   A&P:        Respectfully,  Delilah Shan, PA-S2

## 2017-02-25 NOTE — Progress Notes (Signed)
Patient ID: Kathryn Frye, female    DOB: 31-Mar-1976, 41 y.o.   MRN: [id number]  PCP: Reginia Forts, MD (patient has never seen this provider, but had to select one for her insurance card. One previous visit in this office, 09/2015)  Chief Complaint  Patient presents with  . Hypertension    Subjective:   Presents for evaluation of high blood pressure.  She has a history of HTN which resolved following gastric sleeve surgery in 2014. She doesn't recall what medications she was on prior.  Yesterday at the dentist her BP was 160/115 and she was advised that the procedure she needs to address a dental abscess could not be performed until her BP was controlled.  She is asymptomatic-no CP, SOB, HA, dizziness, LE edema, visual changes, palpitations. Lost about 50 lbs after her surgery.  Previous BP readings in her record: 11/2016 140/98 07/2016 120/76 05/2016 120/74 01/2016 120/74 11/2015 116/74 09/2015 136/80     Review of Systems Constitutional: Negative for activity change, appetite change, fatigueand unexpected weight change.  HENT: Negative for congestion, dental problem, ear pain, hearing loss, mouth sores, postnasal drip, rhinorrhea, sneezing, sore throat, tinnitusand trouble swallowing.  Eyes: Negative for photophobia, pain, rednessand visual disturbance.  Respiratory: Negative for cough, chest tightnessand shortness of breath.  Cardiovascular: Negative for chest pain, palpitationsand leg swelling.  Gastrointestinal: Negative for abdominal pain, blood in stool, constipation, diarrhea, nauseaand vomiting.  Genitourinary: Negative for dysuria, frequency, hematuriaand urgency.  Musculoskeletal: Negative for arthralgias, gait problem, myalgiasand neck stiffness.  Skin: Negative for rash.  Neurological: Negative for dizziness, speech difficulty, weakness, light-headedness, numbnessand headaches.  Hematological: Negative for adenopathy.  Psychiatric/Behavioral:  Negative for confusionand sleep disturbance. The patient is not nervous/anxious.     Patient Active Problem List   Diagnosis Date Noted  . Elevated blood pressure reading 02/25/2017  . H/O bariatric surgery 12/06/2015     Prior to Admission medications   Not on File     Allergies  Allergen Reactions  . Shellfish Allergy Itching, Swelling and Rash    Itching , rash and throat swelling , no epi pen . No breathing problems never eats shrimp now       Objective:  Physical Exam  Constitutional: She is oriented to person, place, and time. She appears well-developed and well-nourished. She is active and cooperative. No distress.  BP (!) 144/80   Pulse 81   Temp 98 F (36.7 C) (Oral)   Resp 18   Ht 5\' 3"  (1.6 m)   Wt 155 lb (70.3 kg)   LMP 01/29/2017 (Approximate)   SpO2 100%   BMI 27.46 kg/m   HENT:  Head: Normocephalic and atraumatic.  Right Ear: Hearing normal.  Left Ear: Hearing normal.  Eyes: Conjunctivae are normal. No scleral icterus.  Neck: Normal range of motion. Neck supple. No thyromegaly present.  Cardiovascular: Normal rate, regular rhythm and normal heart sounds.   Pulses:      Radial pulses are 2+ on the right side, and 2+ on the left side.  Pulmonary/Chest: Effort normal and breath sounds normal.  Lymphadenopathy:       Head (right side): No tonsillar, no preauricular, no posterior auricular and no occipital adenopathy present.       Head (left side): No tonsillar, no preauricular, no posterior auricular and no occipital adenopathy present.    She has no cervical adenopathy.       Right: No supraclavicular adenopathy present.       Left: No  supraclavicular adenopathy present.  Neurological: She is alert and oriented to person, place, and time. No sensory deficit.  Skin: Skin is warm, dry and intact. No rash noted. No cyanosis or erythema. Nails show no clubbing.  Psychiatric: She has a normal mood and affect. Her speech is normal and behavior is  normal.           Assessment & Plan:   1. Elevated blood pressure reading BP nearly normal here. Suspect that the significant elevation yesterday was due to her dental pain and anxiety over the procedure. Letter clearing her for the procedure, with plans to recheck BP here in 2 weeks.    Return in about 2 weeks (around 03/11/2017).   Fara Chute, PA-C [facility]

## 2017-03-17 ENCOUNTER — Ambulatory Visit: Payer: BC Managed Care – PPO | Admitting: Physician Assistant

## 2017-08-10 ENCOUNTER — Encounter (HOSPITAL_COMMUNITY): Payer: Self-pay | Admitting: *Deleted

## 2017-08-10 ENCOUNTER — Ambulatory Visit: Payer: BC Managed Care – PPO | Admitting: Physician Assistant

## 2017-08-10 DIAGNOSIS — I10 Essential (primary) hypertension: Secondary | ICD-10-CM | POA: Insufficient documentation

## 2017-08-10 DIAGNOSIS — Z5321 Procedure and treatment not carried out due to patient leaving prior to being seen by health care provider: Secondary | ICD-10-CM | POA: Insufficient documentation

## 2017-08-10 NOTE — ED Triage Notes (Signed)
The pt has not been feeling well over this past weekend  She went [facility] today and was told to come here her bp was high.  She has not had meds for this for 3-4 years  No pain anywhere  Lm,p  3 weeks ago

## 2017-08-11 ENCOUNTER — Emergency Department (HOSPITAL_COMMUNITY)
Admission: EM | Admit: 2017-08-11 | Discharge: 2017-08-11 | Disposition: A | Discharge status: Home / Self Care | Payer: BC Managed Care – PPO | Attending: Emergency Medicine | Admitting: Emergency Medicine

## 2017-08-11 ENCOUNTER — Encounter: Payer: Self-pay | Admitting: Physician Assistant

## 2017-08-11 ENCOUNTER — Ambulatory Visit (INDEPENDENT_AMBULATORY_CARE_PROVIDER_SITE_OTHER): Payer: BC Managed Care – PPO | Admitting: Physician Assistant

## 2017-08-11 VITALS — BP 132/82 | HR 90 | Temp 98.2°F | Resp 17 | Ht 64.5 in | Wt 157.0 lb

## 2017-08-11 DIAGNOSIS — R030 Elevated blood-pressure reading, without diagnosis of hypertension: Secondary | ICD-10-CM

## 2017-08-11 DIAGNOSIS — H6983 Other specified disorders of Eustachian tube, bilateral: Secondary | ICD-10-CM

## 2017-08-11 DIAGNOSIS — J302 Other seasonal allergic rhinitis: Secondary | ICD-10-CM

## 2017-08-11 DIAGNOSIS — I10 Essential (primary) hypertension: Secondary | ICD-10-CM

## 2017-08-11 HISTORY — DX: Essential (primary) hypertension: I10

## 2017-08-11 MED ORDER — FLUTICASONE PROPIONATE 50 MCG/ACT NA SUSP: 2.0000 | Freq: Every day | NASAL | 12 refills | Status: AC

## 2017-08-11 MED ORDER — CETIRIZINE HCL 10 MG PO TABS: 10.0000 mg | ORAL_TABLET | Freq: Every day | ORAL | 11 refills | Status: DC

## 2017-08-11 MED ORDER — AMLODIPINE BESYLATE 5 MG PO TABS: 5.0000 mg | ORAL_TABLET | Freq: Every day | ORAL | 3 refills | Status: DC

## 2017-08-11 NOTE — Patient Instructions (Addendum)
DASH Eating Plan DASH stands for "Dietary Approaches to Stop Hypertension." The DASH eating plan is a healthy eating plan that has been shown to reduce high blood pressure (hypertension). It may also reduce your risk for type 2 diabetes, heart disease, and stroke. The DASH eating plan may also help with weight loss. What are tips for following this plan? General guidelines  Avoid eating more than 2,300 mg (milligrams) of salt (sodium) a day. If you have hypertension, you may need to reduce your sodium intake to 1,500 mg a day.  Limit alcohol intake to no more than 1 drink a day for nonpregnant women and 2 drinks a day for men. One drink equals 12 oz of beer, 5 oz of wine, or 1 oz of hard liquor.  Work with your health care provider to maintain a healthy body weight or to lose weight. Ask what an ideal weight is for you.  Get at least 30 minutes of exercise that causes your heart to beat faster (aerobic exercise) most days of the week. Activities may include walking, swimming, or biking.  Work with your health care provider or diet and nutrition specialist (dietitian) to adjust your eating plan to your individual calorie needs. Reading food labels  Check food labels for the amount of sodium per serving. Choose foods with less than 5 percent of the Daily Value of sodium. Generally, foods with less than 300 mg of sodium per serving fit into this eating plan.  To find whole grains, look for the word "whole" as the first word in the ingredient list. Shopping  Buy products labeled as "low-sodium" or "no salt added."  Buy fresh foods. Avoid canned foods and premade or frozen meals. Cooking  Avoid adding salt when cooking. Use salt-free seasonings or herbs instead of table salt or sea salt. Check with your health care provider or pharmacist before using salt substitutes.  Do not fry foods. Cook foods using healthy methods such as baking, boiling, grilling, and broiling instead.  Cook with  heart-healthy oils, such as olive, canola, soybean, or sunflower oil. Meal planning   Eat a balanced diet that includes: ? 5 or more servings of fruits and vegetables each day. At each meal, try to fill half of your plate with fruits and vegetables. ? Up to 6-8 servings of whole grains each day. ? Less than 6 oz of lean meat, poultry, or fish each day. A 3-oz serving of meat is about the same size as a deck of cards. One egg equals 1 oz. ? 2 servings of low-fat dairy each day. ? A serving of nuts, seeds, or beans 5 times each week. ? Heart-healthy fats. Healthy fats called Omega-3 fatty acids are found in foods such as flaxseeds and coldwater fish, like sardines, salmon, and mackerel.  Limit how much you eat of the following: ? Canned or prepackaged foods. ? Food that is high in trans fat, such as fried foods. ? Food that is high in saturated fat, such as fatty meat. ? Sweets, desserts, sugary drinks, and other foods with added sugar. ? Full-fat dairy products.  Do not salt foods before eating.  Try to eat at least 2 vegetarian meals each week.  Eat more home-cooked food and less restaurant, buffet, and fast food.  When eating at a restaurant, ask that your food be prepared with less salt or no salt, if possible. What foods are recommended? The items listed may not be a complete list. Talk with your dietitian about what   dietary choices are best for you. Grains Whole-grain or whole-wheat bread. Whole-grain or whole-wheat pasta. Brown rice. Oatmeal. Quinoa. Bulgur. Whole-grain and low-sodium cereals. Pita bread. Low-fat, low-sodium crackers. Whole-wheat flour tortillas. Vegetables Fresh or frozen vegetables (raw, steamed, roasted, or grilled). Low-sodium or reduced-sodium tomato and vegetable juice. Low-sodium or reduced-sodium tomato sauce and tomato paste. Low-sodium or reduced-sodium canned vegetables. Fruits All fresh, dried, or frozen fruit. Canned fruit in natural juice (without  added sugar). Meat and other protein foods Skinless chicken or turkey. Ground chicken or turkey. Pork with fat trimmed off. Fish and seafood. Egg whites. Dried beans, peas, or lentils. Unsalted nuts, nut butters, and seeds. Unsalted canned beans. Lean cuts of beef with fat trimmed off. Low-sodium, lean deli meat. Dairy Low-fat (1%) or fat-free (skim) milk. Fat-free, low-fat, or reduced-fat cheeses. Nonfat, low-sodium ricotta or cottage cheese. Low-fat or nonfat yogurt. Low-fat, low-sodium cheese. Fats and oils Soft margarine without trans fats. Vegetable oil. Low-fat, reduced-fat, or light mayonnaise and salad dressings (reduced-sodium). Canola, safflower, olive, soybean, and sunflower oils. Avocado. Seasoning and other foods Herbs. Spices. Seasoning mixes without salt. Unsalted popcorn and pretzels. Fat-free sweets. What foods are not recommended? The items listed may not be a complete list. Talk with your dietitian about what dietary choices are best for you. Grains Baked goods made with fat, such as croissants, muffins, or some breads. Dry pasta or rice meal packs. Vegetables Creamed or fried vegetables. Vegetables in a cheese sauce. Regular canned vegetables (not low-sodium or reduced-sodium). Regular canned tomato sauce and paste (not low-sodium or reduced-sodium). Regular tomato and vegetable juice (not low-sodium or reduced-sodium). Pickles. Olives. Fruits Canned fruit in a light or heavy syrup. Fried fruit. Fruit in cream or butter sauce. Meat and other protein foods Fatty cuts of meat. Ribs. Fried meat. Bacon. Sausage. Bologna and other processed lunch meats. Salami. Fatback. Hotdogs. Bratwurst. Salted nuts and seeds. Canned beans with added salt. Canned or smoked fish. Whole eggs or egg yolks. Chicken or turkey with skin. Dairy Whole or 2% milk, cream, and half-and-half. Whole or full-fat cream cheese. Whole-fat or sweetened yogurt. Full-fat cheese. Nondairy creamers. Whipped toppings.  Processed cheese and cheese spreads. Fats and oils Butter. Stick margarine. Lard. Shortening. Ghee. Bacon fat. Tropical oils, such as coconut, palm kernel, or palm oil. Seasoning and other foods Salted popcorn and pretzels. Onion salt, garlic salt, seasoned salt, table salt, and sea salt. Worcestershire sauce. Tartar sauce. Barbecue sauce. Teriyaki sauce. Soy sauce, including reduced-sodium. Steak sauce. Canned and packaged gravies. Fish sauce. Oyster sauce. Cocktail sauce. Horseradish that you find on the shelf. Ketchup. Mustard. Meat flavorings and tenderizers. Bouillon cubes. Hot sauce and Tabasco sauce. Premade or packaged marinades. Premade or packaged taco seasonings. Relishes. Regular salad dressings. Where to find more information:  National Heart, Lung, and Blood Institute: www.nhlbi.nih.gov  American Heart Association: www.heart.org Summary  The DASH eating plan is a healthy eating plan that has been shown to reduce high blood pressure (hypertension). It may also reduce your risk for type 2 diabetes, heart disease, and stroke.  With the DASH eating plan, you should limit salt (sodium) intake to 2,300 mg a day. If you have hypertension, you may need to reduce your sodium intake to 1,500 mg a day.  When on the DASH eating plan, aim to eat more fresh fruits and vegetables, whole grains, lean proteins, low-fat dairy, and heart-healthy fats.  Work with your health care provider or diet and nutrition specialist (dietitian) to adjust your eating plan to your individual   calorie needs. This information is not intended to replace advice given to you by your health care provider. Make sure you discuss any questions you have with your health care provider. Document Released: 10/23/2011 Document Revised: 10/27/2016 Document Reviewed: 10/27/2016 Elsevier Interactive Patient Education  2017 [place].   Eustachian Tube Dysfunction The eustachian tube connects the middle ear to the back of the  nose. It regulates air pressure in the middle ear by allowing air to move between the ear and nose. It also helps to drain fluid from the middle ear space. When the eustachian tube does not function properly, air pressure, fluid, or both can build up in the middle ear. Eustachian tube dysfunction can affect one or both ears. What are the causes? This condition happens when the eustachian tube becomes blocked or cannot open normally. This may result from:  Ear infections.  Colds and other upper respiratory infections.  Allergies.  Irritation, such as from cigarette smoke or acid from the stomach coming up into the esophagus (gastroesophageal reflux).  Sudden changes in air pressure, such as from descending in an airplane.  Abnormal growths in the nose or throat, such as nasal polyps, tumors, or enlarged tissue at the back of the throat (adenoids).  What increases the risk? This condition may be more likely to develop in people who smoke and people who are overweight. Eustachian tube dysfunction may also be more likely to develop in children, especially children who have:  Certain birth defects of the mouth, such as cleft palate.  Large tonsils and adenoids.  What are the signs or symptoms? Symptoms of this condition may include:  A feeling of fullness in the ear.  Ear pain.  Clicking or popping noises in the ear.  Ringing in the ear.  Hearing loss.  Loss of balance.  Symptoms may get worse when the air pressure around you changes, such as when you travel to an area of high elevation or fly on an airplane. How is this diagnosed? This condition may be diagnosed based on:  Your symptoms.  A physical exam of your ear, nose, and throat.  Tests, such as those that measure: ? The movement of your eardrum (tympanogram). ? Your hearing (audiometry).  How is this treated? Treatment depends on the cause and severity of your condition. If your symptoms are mild, you may be able  to relieve your symptoms by moving air into ("popping") your ears. If you have symptoms of fluid in your ears, treatment may include:  Decongestants.  Antihistamines.  Nasal sprays or ear drops that contain medicines that reduce swelling (steroids).  In some cases, you may need to have a procedure to drain the fluid in your eardrum (myringotomy). In this procedure, a small tube is placed in the eardrum to:  Drain the fluid.  Restore the air in the middle ear space.  Follow these instructions at home:  Take over-the-counter and prescription medicines only as told by your health care provider.  Use techniques to help pop your ears as recommended by your health care provider. These may include: ? Chewing gum. ? Yawning. ? Frequent, forceful swallowing. ? Closing your mouth, holding your nose closed, and gently blowing as if you are trying to blow air out of your nose.  Do not do any of the following until your health care provider approves: ? Travel to high altitudes. ? Fly in airplanes. ? Work in a Pension scheme manager or room. ? Scuba dive.  Keep your ears dry. Dry  your ears completely after showering or bathing.  Do not smoke.  Keep all follow-up visits as told by your health care provider. This is important. Contact a health care provider if:  Your symptoms do not go away after treatment.  Your symptoms come back after treatment.  You are unable to pop your ears.  You have: ? A fever. ? Pain in your ear. ? Pain in your head or neck. ? Fluid draining from your ear.  Your hearing suddenly changes.  You become very dizzy.  You lose your balance. This information is not intended to replace advice given to you by your health care provider. Make sure you discuss any questions you have with your health care provider. Document Released: 11/30/2015 Document Revised: 04/10/2016 Document Reviewed: 11/22/2014 Elsevier Interactive Patient Education  2018 [employer].     IF you received an x-ray today, you will receive an invoice from [facility]. Please contact [facility] at [phone] with questions or concerns regarding your invoice.   IF you received labwork today, you will receive an invoice from [facility]. Please contact [facility] at [phone] with questions or concerns regarding your invoice.   Our billing staff will not be able to assist you with questions regarding bills from these companies.  You will be contacted with the lab results as soon as they are available. The fastest way to get your results is to activate your My Chart account. Instructions are located on the last page of this paperwork. If you have not heard from [place] regarding the results in 2 weeks, please contact this office.

## 2017-08-11 NOTE — Progress Notes (Signed)
PRIMARY CARE AT [facility] [address], [place] [address] [phone]  Date:  08/11/2017   Name:  Kathryn Frye   DOB:  May 10, 1976   MRN:  [id number]  PCP:  Wardell Honour, MD    History of Present Illness:  Kathryn Frye is a 41 y.o. female patient who presents to PCP with  Chief Complaint  Patient presents with  . Sinusitis     Patient is here with elevated blood pressure.  This was noted at a fastmed an elevated BP of over 190.  They were not able to start her on a medication because they were not sure of her metabolic function, and concerned of follow up care.  She went to the ED, but was not able to be seen for hours, so she left.   No chest pains, palpitations, sob, headache, dizziness, or leg swellings.   She is eating a lot of packaged lunch meat for work.   Patient had been placed on a BP medication, years ago, but this was discontinued with intentional weight loss.    Patient's last menstrual period was 07/27/2017. She uses protection with sex.   URI current symptoms for a few days: Stuffiness itching.  No sore throat, or coughing.  She is sneezing.  She has seasonal allergies. No facial pain.  No medications at this time for this.  To note, patient was teen 5 months ago by [facility] Mons for elevated BP.  Simultaneously, she was having dental pain, and BP was not greatly high.  She was advised to follow up in 2 weeks, but never followed up.  Patient Active Problem List   Diagnosis Date Noted  . Elevated blood pressure reading 02/25/2017  . H/O bariatric surgery 12/06/2015    Past Medical History:  Diagnosis Date  . Anemia   . Asthma   . Heartburn    takes OTC  . Hypertension     Past Surgical History:  Procedure Laterality Date  . COLONOSCOPY    . GALLBLADDER SURGERY    . HERNIA REPAIR    . LAPAROSCOPIC GASTRIC SLEEVE RESECTION      Social History  Substance Use Topics  . Smoking status: Never Smoker  . Smokeless tobacco: Never Used  .  Alcohol use 0.0 oz/week    Family History  Problem Relation Age of Onset  . Cancer Father   . Hypertension Maternal Grandmother   . Diabetes Paternal Grandmother   . Heart disease Paternal Grandmother   . Stroke Paternal Grandmother     Allergies  Allergen Reactions  . Shellfish Allergy Itching, Swelling and Rash    Itching , rash and throat swelling , no epi pen . No breathing problems never eats shrimp now    Medication list has been reviewed and updated.  No current outpatient prescriptions on file prior to visit.   No current facility-administered medications on file prior to visit.     ROS ROS otherwise unremarkable unless listed above.  Physical Examination: BP 132/82   Pulse 90   Temp 98.2 F (36.8 C) (Oral)   Resp 17   Ht 5' 4.5" (1.638 m)   Wt 157 lb (71.2 kg)   LMP 07/27/2017   SpO2 98%   BMI 26.53 kg/m  Ideal Body Weight: Weight in (lb) to have BMI = 25: 147.6  Physical Exam  Constitutional: She is oriented to person, place, and time. She appears well-developed and well-nourished. No distress.  HENT:  Head: Normocephalic and atraumatic.  Right  Ear: External ear and ear canal normal. A middle ear effusion is present.  Left Ear: External ear and ear canal normal. A middle ear effusion (non-purulent) is present.  Nose: Mucosal edema and rhinorrhea present. Right sinus exhibits no maxillary sinus tenderness and no frontal sinus tenderness. Left sinus exhibits no maxillary sinus tenderness and no frontal sinus tenderness.  Mouth/Throat: No uvula swelling. No oropharyngeal exudate, posterior oropharyngeal edema or posterior oropharyngeal erythema.  Eyes: Pupils are equal, round, and reactive to light. Conjunctivae and EOM are normal.  Cardiovascular: Normal rate and regular rhythm.  Exam reveals no gallop, no distant heart sounds and no friction rub.   No murmur heard. Pulses:      Radial pulses are 2+ on the right side, and 2+ on the left side.        Dorsalis pedis pulses are 2+ on the right side, and 2+ on the left side.  Pulmonary/Chest: Effort normal. No respiratory distress. She has no decreased breath sounds. She has no wheezes. She has no rhonchi.  Musculoskeletal: She exhibits no edema.  Lymphadenopathy:       Head (right side): No submandibular, no tonsillar, no preauricular and no posterior auricular adenopathy present.       Head (left side): No submandibular, no tonsillar, no preauricular and no posterior auricular adenopathy present.  Neurological: She is alert and oriented to person, place, and time.  Skin: She is not diaphoretic.  Psychiatric: She has a normal mood and affect. Her behavior is normal.     Assessment and Plan: Kathryn Frye is a 41 y.o. female who is here today for cc of elevated BP and UR symptoms.  2 accounts of elevated BP seen on fast med documentation, and in ED.  We will start on ccb.  Advised to return in 2 weeks for follow up EKG appears normal.  She may need tooth extraction so this was also performed in lieu of clearance for possible procedure.  Starting her on allergy pill.  Advised of use for seasonal allergy and stopping Flonase once this improves.  Essential hypertension - Plan: CMP14+EGFR, amLODipine (NORVASC) 5 MG tablet  Seasonal allergic rhinitis, unspecified trigger - Plan: cetirizine (ZYRTEC) 10 MG tablet, fluticasone (FLONASE) 50 MCG/ACT nasal spray  Dysfunction of both eustachian tubes - Plan: cetirizine (ZYRTEC) 10 MG tablet, fluticasone (FLONASE) 50 MCG/ACT nasal spray  Elevated blood pressure reading - Plan: EKG 12-Lead  Ivar Drape, PA-C [facility] 9/28/20189:14 AM

## 2017-08-11 NOTE — ED Notes (Signed)
Patient came to [facility] and stated she was leaving.

## 2017-08-12 LAB — CMP14+EGFR
A/G RATIO: 1.5 (ref 1.2–2.2)
ALBUMIN: 4.5 g/dL (ref 3.5–5.5)
ALK PHOS: 111 IU/L (ref 39–117)
ALT: 8 IU/L (ref 0–32)
AST: 20 IU/L (ref 0–40)
BUN / CREAT RATIO: 15 (ref 9–23)
BUN: 13 mg/dL (ref 6–24)
Bilirubin Total: 0.3 mg/dL (ref 0.0–1.2)
CO2: 25 mmol/L (ref 20–29)
Calcium: 9.4 mg/dL (ref 8.7–10.2)
Chloride: 97 mmol/L (ref 96–106)
Creatinine, Ser: 0.84 mg/dL (ref 0.57–1.00)
GFR calc Af Amer: 98 mL/min/{1.73_m2} (ref >59)
GFR calc non Af Amer: 85 mL/min/{1.73_m2} (ref >59)
GLOBULIN, TOTAL: 3.1 g/dL (ref 1.5–4.5)
Glucose: 70 mg/dL (ref 65–99)
POTASSIUM: 4.8 mmol/L (ref 3.5–5.2)
SODIUM: 137 mmol/L (ref 134–144)
Total Protein: 7.6 g/dL (ref 6.0–8.5)

## 2017-08-26 ENCOUNTER — Ambulatory Visit: Payer: BC Managed Care – PPO | Admitting: Physician Assistant

## 2018-02-24 ENCOUNTER — Encounter: Payer: Self-pay | Admitting: Physician Assistant

## 2018-05-17 DIAGNOSIS — R87612 Low grade squamous intraepithelial lesion on cytologic smear of cervix (LGSIL): Secondary | ICD-10-CM

## 2018-05-17 HISTORY — DX: Low grade squamous intraepithelial lesion on cytologic smear of cervix (LGSIL): R87.612

## 2018-05-18 ENCOUNTER — Ambulatory Visit: Payer: BC Managed Care – PPO | Admitting: Gynecology

## 2018-05-18 ENCOUNTER — Encounter: Payer: Self-pay | Admitting: Gynecology

## 2018-05-18 VITALS — BP 118/78 | Ht 64.0 in | Wt 153.0 lb

## 2018-05-18 DIAGNOSIS — Z3009 Encounter for other general counseling and advice on contraception: Secondary | ICD-10-CM | POA: Diagnosis not present

## 2018-05-18 DIAGNOSIS — Z01419 Encounter for gynecological examination (general) (routine) without abnormal findings: Secondary | ICD-10-CM | POA: Diagnosis not present

## 2018-05-18 DIAGNOSIS — Z1151 Encounter for screening for human papillomavirus (HPV): Secondary | ICD-10-CM | POA: Diagnosis not present

## 2018-05-18 DIAGNOSIS — D259 Leiomyoma of uterus, unspecified: Secondary | ICD-10-CM

## 2018-05-18 DIAGNOSIS — N6001 Solitary cyst of right breast: Secondary | ICD-10-CM | POA: Diagnosis not present

## 2018-05-18 LAB — CBC WITH DIFFERENTIAL/PLATELET
BASOS ABS: 38 {cells}/uL (ref 0–200)
Basophils Relative: 0.4 %
EOS ABS: 56 {cells}/uL (ref 15–500)
Eosinophils Relative: 0.6 %
HEMATOCRIT: 32.4 % — AB (ref 35.0–45.0)
Hemoglobin: 10.1 g/dL — ABNORMAL LOW (ref 11.7–15.5)
LYMPHS ABS: 1372 {cells}/uL (ref 850–3900)
MCH: 24.9 pg — AB (ref 27.0–33.0)
MCHC: 31.2 g/dL — ABNORMAL LOW (ref 32.0–36.0)
MCV: 80 fL (ref 80.0–100.0)
MPV: 10.5 fL (ref 7.5–12.5)
Monocytes Relative: 7 %
NEUTROS PCT: 77.4 %
Neutro Abs: 7276 cells/uL (ref 1500–7800)
Platelets: 478 10*3/uL — ABNORMAL HIGH (ref 140–400)
RBC: 4.05 10*6/uL (ref 3.80–5.10)
RDW: 15.4 % — AB (ref 11.0–15.0)
Total Lymphocyte: 14.6 %
WBC: 9.4 10*3/uL (ref 3.8–10.8)
WBCMIX: 658 {cells}/uL (ref 200–950)

## 2018-05-18 MED ORDER — NORETHINDRONE ACET-ETHINYL EST 1-20 MG-MCG PO TABS: 1.0000 | ORAL_TABLET | Freq: Every day | ORAL | 11 refills | Status: DC

## 2018-05-18 NOTE — Patient Instructions (Addendum)
Start on the birth control pills as we discussed.  Call if any issues.  Schedule your mammogram

## 2018-05-18 NOTE — Progress Notes (Signed)
Kathryn Frye May 23, 1976 [id number]        42 y.o.  G1P0011 for annual gynecologic exam.  Patient wants to discuss contraceptive options.  She also noticed a lump in her right breast over the last several weeks never noticed before.  Not painful or otherwise symptomatic but noted on self breast exam.  Last mammogram 12/2015.  Past medical history,surgical history, problem list, medications, allergies, family history and social history were all reviewed and documented as reviewed in the EPIC chart.  ROS:  Performed with pertinent positives and negatives included in the history, assessment and plan.   Additional significant findings : None   Exam: [name] assistant Vitals:   05/18/18 0946  BP: 118/78  Weight: 153 lb (69.4 kg)  Height: 5\' 4"  (1.626 m)   Body mass index is 26.26 kg/m.  General appearance:  Normal affect, orientation and appearance. Skin: Grossly normal HEENT: Without gross lesions.  No cervical or supraclavicular adenopathy. Thyroid normal.  Lungs:  Clear without wheezing, rales or rhonchi Cardiac: RR, without RMG Abdominal:  Soft, nontender, without masses, guarding, rebound, organomegaly or hernia Breasts:  Examined lying and sitting.  Left without masses, retractions, discharge or axillary adenopathy.  Right with grape size well defined firm mobile mass tail of Spence region as diagrammed.  No overlying skin changes, nipple discharge or axillary adenopathy.   Pelvic:  Ext, BUS, Vagina: Normal  Cervix: Normal.  Pap smear/HPV  Uterus: Anteverted, normal size, midline and mobile nontender   Adnexa: Without masses or tenderness    Anus and perineum: Normal   Rectovaginal: Normal sphincter tone without palpated masses or tenderness.   Procedure: The skin overlying the right breast mass was cleansed with alcohol, infiltrated with 1% lidocaine and subsequently the mass was entered and aspirated of clear yellow fluid, 4 to 5 cc total.  The mass was noted to  completely resolve after aspiration.  The cyst fluid was discarded.  Assessment/Plan:  42 y.o. G75P0011 female for annual gynecologic exam with regular menses, condom contraception.  1. Right breast cyst.  Aspirated as above.  No residual abnormalities on exam after aspiration.  Patient will continue with self breast exams and as long as no palpable abnormalities will follow expectantly.  Recommended patient schedule a baseline mammogram now but to wait 1 month to allow for post aspiration changes to resolve.   2. Contraceptive management.  Reviewed all options of contraception to include pill, patch, ring, Nexplanon, Depo-Provera and IUDs.  She had a ParaGard IUD which was difficult to remove and subsequently a Mirena IUD which spontaneously extruded.  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  Do not feel IUD is wise at this point.  Risks of oral contraceptives to include thrombosis discussed.  She has never smoked and not being followed for medical issues.  The patient prefers to start oral contraceptives now.  She does have a history of anemia with her leiomyoma.  We will go ahead and recheck her hemoglobin today.  Discussed every other month to every third month withdrawal option which may help with her anemia.  Off brand labeling discussed.  Loestrin 1/20 equivalent prescribed x1 year.  She will follow-up if she has any issues once starting. 3. Pap smear 2017.  Pap smear/HPV today.  No history of significant abnormal Pap smears previously. 4. History of leiomyoma.  Exam shows uterus roughly normal size.  Will continue with annual exam follow-up.  Check baseline hemoglobin due to anemia history in  the past. 5. Health maintenance.  No routine lab work drawn as patient relates she is making an appointment to see her primary physician and will follow-up with them for routine blood work.  Follow-up in 1 year assuming she does well starting the pills otherwise  follow-up sooner if any issues.   Anastasio Auerbach MD, 10:05 AM 05/18/2018

## 2018-05-18 NOTE — Addendum Note (Signed)
Addended by: Nelva Nay on: 05/18/2018 10:34 AM   Modules accepted: Orders

## 2018-05-19 ENCOUNTER — Other Ambulatory Visit: Payer: Self-pay | Admitting: Gynecology

## 2018-05-19 DIAGNOSIS — D500 Iron deficiency anemia secondary to blood loss (chronic): Secondary | ICD-10-CM

## 2018-05-19 LAB — PAP IG AND HPV HIGH-RISK: HPV DNA High Risk: NOT DETECTED

## 2018-05-21 ENCOUNTER — Other Ambulatory Visit: Payer: Self-pay

## 2018-05-21 ENCOUNTER — Encounter: Payer: Self-pay | Admitting: Gynecology

## 2018-05-21 ENCOUNTER — Ambulatory Visit: Payer: BC Managed Care – PPO | Admitting: Family Medicine

## 2018-05-21 VITALS — BP 137/84 | HR 119 | Temp 101.5°F | Ht 64.0 in | Wt 151.4 lb

## 2018-05-21 DIAGNOSIS — R05 Cough: Secondary | ICD-10-CM | POA: Diagnosis not present

## 2018-05-21 DIAGNOSIS — R509 Fever, unspecified: Secondary | ICD-10-CM | POA: Diagnosis not present

## 2018-05-21 DIAGNOSIS — R059 Cough, unspecified: Secondary | ICD-10-CM

## 2018-05-21 LAB — POC INFLUENZA A&B (BINAX/QUICKVUE)
Influenza A, POC: NEGATIVE
Influenza B, POC: NEGATIVE

## 2018-05-21 MED ORDER — BENZONATATE 100 MG PO CAPS: 100.0000 mg | ORAL_CAPSULE | Freq: Three times a day (TID) | ORAL | 0 refills | Status: DC | PRN

## 2018-05-21 MED ORDER — AZITHROMYCIN 250 MG PO TABS: ORAL_TABLET | ORAL | 0 refills | Status: DC

## 2018-05-21 NOTE — Patient Instructions (Addendum)
Take antibiotic if fever persists within the next 48-72 hours with worsening cough (productive), shortness of breath, overall getting worse.     IF you received an x-ray today, you will receive an invoice from [facility]. Please contact [facility] at [phone] with questions or concerns regarding your invoice.   IF you received labwork today, you will receive an invoice from [facility]. Please contact [facility] at [phone] with questions or concerns regarding your invoice.   Our billing staff will not be able to assist you with questions regarding bills from these companies.  You will be contacted with the lab results as soon as they are available. The fastest way to get your results is to activate your My Chart account. Instructions are located on the last page of this paperwork. If you have not heard from [place] regarding the results in 2 weeks, please contact this office.      Viral Illness, Adult Viruses are tiny germs that can get into a person's body and cause illness. There are many different types of viruses, and they cause many types of illness. Viral illnesses can range from mild to severe. They can affect various parts of the body. Common illnesses that are caused by a virus include colds and the flu. Viral illnesses also include serious conditions such as HIV/AIDS (human immunodeficiency virus/acquired immunodeficiency syndrome). A few viruses have been linked to certain cancers. What are the causes? Many types of viruses can cause illness. Viruses invade cells in your body, multiply, and cause the infected cells to malfunction or die. When the cell dies, it releases more of the virus. When this happens, you develop symptoms of the illness, and the virus continues to spread to other cells. If the virus takes over the function of the cell, it can cause the cell to divide and grow out of control, as is the case when a virus causes cancer. Different viruses get into  the body in different ways. You can get a virus by:  Swallowing food or water that is contaminated with the virus.  Breathing in droplets that have been coughed or sneezed into the air by an infected person.  Touching a surface that has been contaminated with the virus and then touching your eyes, nose, or mouth.  Being bitten by an insect or animal that carries the virus.  Having sexual contact with a person who is infected with the virus.  Being exposed to blood or fluids that contain the virus, either through an open cut or during a transfusion.  If a virus enters your body, your body's defense system (immune system) will try to fight the virus. You may be at higher risk for a viral illness if your immune system is weak. What are the signs or symptoms? Symptoms vary depending on the type of virus and the location of the cells that it invades. Common symptoms of the main types of viral illnesses include: Cold and flu viruses  Fever.  Headache.  Sore throat.  Muscle aches.  Nasal congestion.  Cough. Digestive system (gastrointestinal) viruses  Fever.  Abdominal pain.  Nausea.  Diarrhea. Liver viruses (hepatitis)  Loss of appetite.  Tiredness.  Yellowing of the skin (jaundice). Brain and spinal cord viruses  Fever.  Headache.  Stiff neck.  Nausea and vomiting.  Confusion or sleepiness. Skin viruses  Warts.  Itching.  Rash. Sexually transmitted viruses  Discharge.  Swelling.  Redness.  Rash. How is this treated? Viruses can be difficult to treat because they  live within cells. Antibiotic medicines do not treat viruses because these drugs do not get inside cells. Treatment for a viral illness may include:  Resting and drinking plenty of fluids.  Medicines to relieve symptoms. These can include over-the-counter medicine for pain and fever, medicines for cough or congestion, and medicines to relieve diarrhea.  Antiviral medicines. These  drugs are available only for certain types of viruses. They may help reduce flu symptoms if taken early. There are also many antiviral medicines for hepatitis and HIV/AIDS.  Some viral illnesses can be prevented with vaccinations. A common example is the flu shot. Follow these instructions at home: Medicines   Take over-the-counter and prescription medicines only as told by your health care provider.  If you were prescribed an antiviral medicine, take it as told by your health care provider. Do not stop taking the medicine even if you start to feel better.  Be aware of when antibiotics are needed and when they are not needed. Antibiotics do not treat viruses. If your health care provider thinks that you may have a bacterial infection as well as a viral infection, you may get an antibiotic. ? Do not ask for an antibiotic prescription if you have been diagnosed with a viral illness. That will not make your illness go away faster. ? Frequently taking antibiotics when they are not needed can lead to antibiotic resistance. When this develops, the medicine no longer works against the bacteria that it normally fights. General instructions  Drink enough fluids to keep your urine clear or pale yellow.  Rest as much as possible.  Return to your normal activities as told by your health care provider. Ask your health care provider what activities are safe for you.  Keep all follow-up visits as told by your health care provider. This is important. How is this prevented? Take these actions to reduce your risk of viral infection:  Eat a healthy diet and get enough rest.  Wash your hands often with soap and water. This is especially important when you are in public places. If soap and water are not available, use hand sanitizer.  Avoid close contact with friends and family who have a viral illness.  If you travel to areas where viral gastrointestinal infection is common, avoid drinking water or  eating raw food.  Keep your immunizations up to date. Get a flu shot every year as told by your health care provider.  Do not share toothbrushes, nail clippers, razors, or needles with other people.  Always practice safe sex.  Contact a health care provider if:  You have symptoms of a viral illness that do not go away.  Your symptoms come back after going away.  Your symptoms get worse. Get help right away if:  You have trouble breathing.  You have a severe headache or a stiff neck.  You have severe vomiting or abdominal pain. This information is not intended to replace advice given to you by your health care provider. Make sure you discuss any questions you have with your health care provider. Document Released: 03/14/2016 Document Revised: 04/16/2016 Document Reviewed: 03/14/2016 Elsevier Interactive Patient Education  [employer].

## 2018-05-21 NOTE — Progress Notes (Signed)
7/5/201912:16 PM  Kathryn Frye December 18, 1975, 42 y.o. female [id number]  Chief Complaint  Patient presents with  . Fever    started yesterday morning, light cough   . Generalized Body Aches    HPI:   Patient is a 42 y.o. female who presents today for fever, body aches and dry cough that started yesterday  She denies any ear pain, sore throat, sinus pain She does have some mild nasal congestion She denies any SOB She denies any nausea, vomiting, abd pain, dysuria, hematuria She denies any sick contacts She will be traveling to [place] this sunday  Fall Risk  05/21/2018 02/25/2017  Falls in the past year? No No     Depression screen [facility] 2/9 05/21/2018 02/25/2017 10/13/2015  Decreased Interest 0 0 0  Down, Depressed, Hopeless 0 0 0  PHQ - 2 Score 0 0 0    Allergies  Allergen Reactions  . Shellfish Allergy Itching, Swelling and Rash    Itching , rash and throat swelling , no epi pen . No breathing problems never eats shrimp now    Prior to Admission medications   Medication Sig Start Date End Date Taking? Authorizing Provider  amLODipine (NORVASC) 5 MG tablet Take 1 tablet (5 mg total) by mouth daily. 08/11/17  Yes English, Colletta Maryland D, PA  cetirizine (ZYRTEC) 10 MG tablet Take 1 tablet (10 mg total) by mouth daily. 08/11/17  Yes English, Stephanie D, PA  fluticasone (FLONASE) 50 MCG/ACT nasal spray Place 2 sprays into both nostrils daily. 08/11/17  Yes English, Colletta Maryland D, PA  norethindrone-ethinyl estradiol (MICROGESTIN,JUNEL,LOESTRIN) 1-20 MG-MCG tablet Take 1 tablet by mouth daily. 05/18/18  Yes Fontaine, Belinda Block, MD    Past Medical History:  Diagnosis Date  . Anemia   . Asthma   . Heartburn    takes OTC  . Hypertension   . 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 COLONOSCOPY    . GALLBLADDER SURGERY    . HERNIA REPAIR    . LAPAROSCOPIC GASTRIC SLEEVE RESECTION      Social History   Tobacco  Use  . Smoking status: Never Smoker  . Smokeless tobacco: Never Used  Substance Use Topics  . Alcohol use: Yes    Alcohol/week: 0.0 oz    Comment: Social    Family History  Problem Relation Age of Onset  . Cancer Father        Bone  . Hypertension Maternal Grandmother   . Diabetes Paternal Grandmother   . Heart disease Paternal Grandmother   . Stroke Paternal Grandmother     ROS Per hpi  OBJECTIVE:  Blood pressure 137/84, pulse (!) 119, temperature (!) 101.5 F (38.6 C), temperature source Oral, height 5\' 4"  (1.626 m), weight 151 lb 6.4 oz (68.7 kg), last menstrual period 05/03/2018, SpO2 95 %.  Physical Exam  Constitutional: She is oriented to person, place, and time. She appears well-developed and well-nourished.  HENT:  Head: Normocephalic and atraumatic.  Right Ear: Hearing, tympanic membrane, external ear and ear canal normal.  Left Ear: Hearing, tympanic membrane, external ear and ear canal normal.  Mouth/Throat: Oropharynx is clear and moist.  Eyes: Pupils are equal, round, and reactive to light. EOM are normal.  Neck: Neck supple.  Cardiovascular: Normal rate, regular rhythm and normal heart sounds. Exam reveals no gallop and no friction rub.  No murmur heard. Pulmonary/Chest: Effort normal and breath sounds normal. She has no wheezes.  She has no rales.  Abdominal: Soft. Bowel sounds are normal. She exhibits no distension. There is no tenderness. There is no CVA tenderness.  Lymphadenopathy:    She has no cervical adenopathy.  Neurological: She is alert and oriented to person, place, and time.  Skin: Skin is warm and dry.  Psychiatric: She has a normal mood and affect.  Nursing note and vitals reviewed.    Results for orders placed or performed in visit on 05/21/18 (from the past 24 hour(s))  POC Influenza A&B(BINAX/QUICKVUE)     Status: None   Collection Time: 05/21/18 12:31 PM  Result Value Ref Range   Influenza A, POC Negative Negative   Influenza B,  POC Negative Negative      ASSESSMENT and PLAN  1. Fever, unspecified 2. Cough Exam benign. Discussed most likely viral. Discussed supportive measures. Given upcoming travel, prescribed abx in case ssx progression and concerning for PNA. RTC precautions reviewed.  - POC Influenza A&B(BINAX/QUICKVUE) - benzonatate (TESSALON) 100 MG capsule; Take 1-2 capsules (100-200 mg total) by mouth 3 (three) times daily as needed for cough. - azithromycin (ZITHROMAX) 250 MG tablet; Take 2 tabs by mouth on first day then take 1 tab by mouth daily  Return if symptoms worsen or fail to improve.    Rutherford Guys, MD [facility] at [facility], [place] [address] Ph.  [phone] Fax [phone]

## 2018-06-16 ENCOUNTER — Ambulatory Visit: Payer: BC Managed Care – PPO | Admitting: Gynecology

## 2018-06-16 ENCOUNTER — Encounter: Payer: Self-pay | Admitting: Gynecology

## 2018-06-16 VITALS — BP 134/80

## 2018-06-16 DIAGNOSIS — R87612 Low grade squamous intraepithelial lesion on cytologic smear of cervix (LGSIL): Secondary | ICD-10-CM | POA: Diagnosis not present

## 2018-06-16 NOTE — Patient Instructions (Signed)
Office will call you with biopsy results 

## 2018-06-16 NOTE — Progress Notes (Signed)
    Kathryn Frye 1976/03/27 [id number]        42 y.o.  G1P0011 presents for colposcopy.  History of first abnormal Pap smear showing LGSIL with negative high risk HPV screen.  Past medical history,surgical history, problem list, medications, allergies, family history and social history were all reviewed and documented in the EPIC chart.  Directed ROS with pertinent positives and negatives documented in the history of present illness/assessment and plan.  Exam: Wandra Scot assistant Vitals:   06/16/18 1157  BP: 134/80   General appearance:  Normal Abdomen soft nontender without masses guarding rebound Pelvic external BUS vagina normal.  Cervix grossly normal.  Uterus irregular consistent with history of leiomyoma.  Adnexa without gross masses or tenderness.  Colposcopy performed after acetic acid cleanse was adequate noting transformation zone seen 360 degrees.  No abnormality seen.  ECC performed.  Patient tolerated well.  Physical Exam  Genitourinary:       Assessment/Plan:  42 y.o. G1P0011 with first abnormal Pap smear showing LGSIL.  Colposcopy was adequate normal.  ECC performed.  I reviewed with the patient cervical dysplasia, high-grade/low-grade, progression/regression.  Her HPV screen was negative for high risk HPV.  The patient will follow-up for her pathology results.  If negative then plan expectant management with Pap smear in 1 year.  If otherwise then will triaged based upon results.    Anastasio Auerbach MD, 12:46 PM 06/16/2018

## 2018-06-18 ENCOUNTER — Encounter: Payer: Self-pay | Admitting: Gynecology

## 2018-06-18 LAB — PATHOLOGY

## 2018-06-18 LAB — TISSUE SPECIMEN

## 2018-11-09 ENCOUNTER — Ambulatory Visit: Payer: BC Managed Care – PPO | Admitting: Nurse Practitioner

## 2018-11-09 ENCOUNTER — Encounter: Payer: Self-pay | Admitting: Nurse Practitioner

## 2018-11-09 VITALS — BP 118/78 | HR 87 | Temp 98.8°F | Ht 64.0 in | Wt 152.2 lb

## 2018-11-09 DIAGNOSIS — B349 Viral infection, unspecified: Secondary | ICD-10-CM | POA: Diagnosis not present

## 2018-11-09 DIAGNOSIS — J9801 Acute bronchospasm: Secondary | ICD-10-CM

## 2018-11-09 DIAGNOSIS — R059 Cough, unspecified: Secondary | ICD-10-CM | POA: Insufficient documentation

## 2018-11-09 DIAGNOSIS — J302 Other seasonal allergic rhinitis: Secondary | ICD-10-CM | POA: Diagnosis not present

## 2018-11-09 DIAGNOSIS — R05 Cough: Secondary | ICD-10-CM | POA: Insufficient documentation

## 2018-11-09 MED ORDER — CETIRIZINE HCL 10 MG PO TABS: 10.0000 mg | ORAL_TABLET | Freq: Every day | ORAL | 1 refills | Status: AC

## 2018-11-09 MED ORDER — ALBUTEROL SULFATE (2.5 MG/3ML) 0.083% IN NEBU
2.5000 mg | INHALATION_SOLUTION | Freq: Once | RESPIRATORY_TRACT | Status: AC
  Administered 2018-11-09: 2.5 mg via RESPIRATORY_TRACT

## 2018-11-09 MED ORDER — ALBUTEROL SULFATE HFA 108 (90 BASE) MCG/ACT IN AERS: 1.0000 | INHALATION_SPRAY | Freq: Four times a day (QID) | RESPIRATORY_TRACT | 0 refills | Status: AC | PRN

## 2018-11-09 MED ORDER — BENZONATATE 100 MG PO CAPS: 100.0000 mg | ORAL_CAPSULE | Freq: Three times a day (TID) | ORAL | 0 refills | Status: DC | PRN

## 2018-11-09 MED ORDER — GUAIFENESIN-DM 100-10 MG/5ML PO SYRP: 5.0000 mL | ORAL_SOLUTION | Freq: Three times a day (TID) | ORAL | 0 refills | Status: DC | PRN

## 2018-11-09 MED ORDER — METHYLPREDNISOLONE ACETATE 40 MG/ML IJ SUSP
40.0000 mg | Freq: Once | INTRAMUSCULAR | Status: AC
  Administered 2018-11-09: 40 mg via INTRAMUSCULAR

## 2018-11-09 NOTE — Patient Instructions (Signed)
Maintain adequate oral hydration. Call office if no improvement in 1week.  May alternate between benzonatate and robitussin for cough.  Asthma Attack  Acute bronchospasm caused by asthma is also referred to as an asthma attack. Bronchospasm means that the air passages become narrowed or "tight," which limits the amount of oxygen that can get into the lungs. The narrowing is caused by inflammation and tightening of the muscles in the air tubes (bronchi) in the lungs. Excessive mucus is also produced, which narrows the airways more. This can cause trouble breathing, coughing, and loud breathing (wheezing). What are the causes? Possible triggers include:  Animal dander from the skin, hair, or feathers of animals.  Dust mites contained in house dust.  Cockroaches.  Pollen from trees or grass.  Mold.  Cigarette or tobacco smoke.  Air pollutants such as dust, household cleaners, hair sprays, aerosol sprays, paint fumes, strong chemicals, or strong odors.  Cold air or weather changes. Cold air may trigger inflammation. Winds increase molds and pollens in the air.  Strong emotions such as crying or laughing hard.  Stress.  Certain medicines, such as aspirin or beta-blockers.  Sulfites in foods and drinks, such as dried fruits and wine.  Infections or inflammatory conditions, such as a flu, a cold, pneumonia, or inflammation of the nasal membranes (rhinitis).  Gastroesophageal reflux disease (GERD). GERD is a condition in which stomach acid backs up into your esophagus, which can irritate nearby airway structures.  Exercise or activity that requires a lot of energy. What are the signs or symptoms? Symptoms of this condition include:  Wheezing. This may sound like whistling while breathing. This may be more noticeable at night.  Excessive coughing, particularly at night.  Chest tightness or pain.  Shortness of breath.  Feeling like you cannot get enough air no matter how hard  you try (air hunger). How is this diagnosed? This condition may be diagnosed based on:  Your medical history.  Your symptoms.  A physical exam.  Tests to check for other causes of your symptoms or other conditions that may have triggered your asthma attack. These tests may include: ? Chest X-ray. ? Blood tests. ? Specialized tests to assess lung function, such as breathing into a device that measures how much air you inhale and exhale (spirometry). How is this treated? The goal of treatment is to open the airways in your lungs and reduce inflammation. Most asthma attacks are treated with medicines that you inhale through a hand-held inhaler (metered dose inhaler, MDI) or a device that turns liquid medicine into a mist that you inhale (nebulizer). Medicines may include:  Quick relief or rescue medicines that relax the muscles of the bronchi. These medicines include bronchodilators, such as albuterol.  Controller medicines, such as inhaled corticosteroids. These are long-acting medicines that are used for daily asthma maintenance. If you have a moderate or severe asthma attack, you may be treated with steroid medicines by mouth or through an IV injection at the hospital. Steroid medicines reduce inflammation in your lungs. Depending on the severity of your attack, you may need oxygen therapy to help you breathe. If your asthma attack was caused by a bacterial infection, such as pneumonia, you will be given antibiotic medicines. Follow these instructions at home: Medicines  Take over-the-counter and prescription medicines only as told by your health care provider. Keep your medicines up-to-date and available.  If you are more than [redacted] weeks pregnant and you are prescribed any new medicines, tell your obstetrician about  those medicines.  If you were prescribed an antibiotic medicine, take it as told by your health care provider. Do not stop taking the antibiotic even if you start to feel  better. Avoiding triggers   Keep track of things that trigger your asthma attacks or cause you to have breathing problems, and avoid exposure to these triggers.  Do not use any products that contain nicotine or tobacco, such as cigarettes and e-cigarettes. If you need help quitting, ask your health care provider.  Avoid secondhand smoke.  Avoid strong smells, such as perfumes, aerosols, and cleaning solvents.  When pollen or air pollution is bad, keep windows closed and use an air conditioner or go to places with air conditioning. Asthma action plan  Work with your health care provider to make a written plan for managing and treating your asthma attacks (asthma action plan). This plan should include: ? A list of your asthma triggers and how to avoid them. ? Information about when your medicines should be taken and when their dosage should be changed. ? Instructions about using a device called a peak flow meter to monitor your condition. A peak flow meter measures how well your lungs are working and measures how severe your asthma is at a given time. Your "personal best" is the highest peak flow rate you can reach when you feel good and have no asthma symptoms. General instructions  Avoid excessive exercise or activity until your asthma attack resolves. Ask your health care provider what activities are safe for you and when you can return to your normal activities.  Stay up to date on all vaccinations recommended by your health care provider, such as flu and pneumonia vaccines.  Drink enough fluid to keep your urine clear or pale yellow. Staying hydrated helps keep mucus in your lungs thin so it can be coughed up easily.  If you drink caffeine, do so in moderation.  Do not use alcohol until you have recovered.  Keep all follow-up visits as told by your health care provider. This is important. Asthma requires careful medical care, and you and your health care provider can work together  to reduce the likelihood of future attacks. Contact a health care provider if:  Your peak flow reading is still at 50-79% of your personal best after you have followed your action plan for 1 hour. This is in the yellow zone, which means "caution."  You need to use a reliever medicine more than 2-3 times a week.  Your medicines are causing side effects, such as: ? Rash. ? Itching. ? Swelling. ? Trouble breathing.  Your symptoms do not improve after 48 hours.  You cough up mucus (sputum) that is thicker than usual.  You have a fever.  You need to use your medicines much more frequently than normal. Get help right away if:  Your peak flow reading is less than 50% of your personal best. This is in the red zone, which means "danger."  You have severe trouble breathing.  You develop chest pain or discomfort.  Your medicines no longer seem to be helping.  You vomit.  You cannot eat or drink without vomiting.  You are coughing up yellow, green, brown, or bloody mucus.  You have a fever and your symptoms suddenly get worse.  You have trouble swallowing.  You feel very tired, and breathing becomes tiring. Summary  Acute bronchospasm caused by asthma is also referred to as an asthma attack.  Bronchospasm is caused by narrowing or  tightness in air passages, which causes shortness of breath, coughing, and loud breathing (wheezing).  Many things can trigger an asthma attack, such as allergens, weather changes, exercise, smoke, and other fumes.  Treatment for an asthma attack may include inhaled rescue medicines for immediate relief, as well as the use of maintenance therapy.  Get help right away if you have worsening shortness of breath, chest pain, or fever, or if your home medicines are no longer helping with your symptoms. This information is not intended to replace advice given to you by your health care provider. Make sure you discuss any questions you have with your health  care provider. Document Released: 02/18/2007 Document Revised: 12/05/2016 Document Reviewed: 12/05/2016 Elsevier Interactive Patient Education  2019 [employer].

## 2018-11-09 NOTE — Progress Notes (Addendum)
Subjective:  Patient ID: Kathryn Frye, female    DOB: 05-24-1976  Age: 42 y.o. MRN: [id number]  CC: Cough (lost voice, started last wednesday, SOB/ using mucinex, having yellow mucus since yesterday and cough. )  Cough  This is a new problem. The current episode started yesterday. The problem has been unchanged. The cough is non-productive. Associated symptoms include heartburn, a sore throat and shortness of breath. Pertinent negatives include no chest pain, chills, ear congestion, ear pain, fever, hemoptysis, myalgias, nasal congestion, postnasal drip, rash, rhinorrhea or wheezing. She has tried OTC cough suppressant for the symptoms. The treatment provided no relief. Her past medical history is significant for asthma and environmental allergies.   Reviewed past Medical, Social and Family history today.  Outpatient Medications Prior to Visit  Medication Sig Dispense Refill  . amLODipine (NORVASC) 5 MG tablet Take 1 tablet (5 mg total) by mouth daily. 90 tablet 3  . fluticasone (FLONASE) 50 MCG/ACT nasal spray Place 2 sprays into both nostrils daily. 16 g 12  . norethindrone-ethinyl estradiol (MICROGESTIN,JUNEL,LOESTRIN) 1-20 MG-MCG tablet Take 1 tablet by mouth daily. 1 Package 11  . cetirizine (ZYRTEC) 10 MG tablet Take 1 tablet (10 mg total) by mouth daily. 30 tablet 11   No facility-administered medications prior to visit.     ROS See HPI  Objective:  BP 118/78 (BP Location: Right Arm, Patient Position: Sitting)   Pulse 87   Temp 98.8 F (37.1 C) (Oral)   Ht 5\' 4"  (1.626 m)   Wt 152 lb 3.2 oz (69 kg)   SpO2 98%   BMI 26.13 kg/m   BP Readings from Last 3 Encounters:  11/09/18 118/78  06/16/18 134/80  05/21/18 137/84    Wt Readings from Last 3 Encounters:  11/09/18 152 lb 3.2 oz (69 kg)  05/21/18 151 lb 6.4 oz (68.7 kg)  05/18/18 153 lb (69.4 kg)   Peak Flow Meter: 220, 250, 290ml/min  Physical Exam Vitals signs reviewed.  Constitutional:      Appearance:  Normal appearance.  HENT:     Right Ear: Tympanic membrane, ear canal and external ear normal.     Left Ear: Tympanic membrane, ear canal and external ear normal.     Nose: Nose normal.     Mouth/Throat:     Pharynx: No oropharyngeal exudate or posterior oropharyngeal erythema.  Cardiovascular:     Rate and Rhythm: Normal rate and regular rhythm.  Pulmonary:     Effort: Pulmonary effort is normal.     Breath sounds: Normal breath sounds.  Neurological:     Mental Status: She is alert.     Lab Results  Component Value Date   WBC 9.4 05/18/2018   HGB 10.1 (L) 05/18/2018   HCT 32.4 (L) 05/18/2018   PLT 478 (H) 05/18/2018   GLUCOSE 70 08/11/2017   CHOL 162 12/06/2015   TRIG 51 12/06/2015   HDL 82 12/06/2015   LDLCALC 70 12/06/2015   ALT 8 08/11/2017   AST 20 08/11/2017   NA 137 08/11/2017   K 4.8 08/11/2017   CL 97 08/11/2017   CREATININE 0.84 08/11/2017   BUN 13 08/11/2017   CO2 25 08/11/2017   TSH 0.958 12/06/2015    Assessment & Plan:   Maelee was seen today for cough.  Diagnoses and all orders for this visit:  Acute bronchospasm due to viral infection -     Peak flow meter -     albuterol (PROVENTIL HFA;VENTOLIN HFA) 108 (90 Base)  MCG/ACT inhaler; Inhale 1-2 puffs into the lungs every 6 (six) hours as needed. -     benzonatate (TESSALON) 100 MG capsule; Take 1 capsule (100 mg total) by mouth 3 (three) times daily as needed for cough. -     methylPREDNISolone acetate (DEPO-MEDROL) injection 40 mg -     albuterol (PROVENTIL) (2.5 MG/3ML) 0.083% nebulizer solution 2.5 mg -     guaiFENesin-dextromethorphan (ROBITUSSIN DM) 100-10 MG/5ML syrup; Take 5 mLs by mouth every 8 (eight) hours as needed for cough.  Seasonal allergic rhinitis, unspecified trigger -     cetirizine (ZYRTEC) 10 MG tablet; Take 1 tablet (10 mg total) by mouth at bedtime.   I have changed Anuradha Nokes's cetirizine. I am also having her start on albuterol, benzonatate, and  guaiFENesin-dextromethorphan. Additionally, I am having her maintain her amLODipine, fluticasone, and norethindrone-ethinyl estradiol. We administered methylPREDNISolone acetate and albuterol.  Meds ordered this encounter  Medications  . albuterol (PROVENTIL HFA;VENTOLIN HFA) 108 (90 Base) MCG/ACT inhaler    Sig: Inhale 1-2 puffs into the lungs every 6 (six) hours as needed.    Dispense:  1 Inhaler    Refill:  0    Order Specific Question:   Supervising Provider    Answer:   Lucille Passy [[id number]]  . benzonatate (TESSALON) 100 MG capsule    Sig: Take 1 capsule (100 mg total) by mouth 3 (three) times daily as needed for cough.    Dispense:  20 capsule    Refill:  0    Order Specific Question:   Supervising Provider    Answer:   Lucille Passy [[id number]]  . cetirizine (ZYRTEC) 10 MG tablet    Sig: Take 1 tablet (10 mg total) by mouth at bedtime.    Dispense:  30 tablet    Refill:  1    Order Specific Question:   Supervising Provider    Answer:   Lucille Passy [[id number]]  . methylPREDNISolone acetate (DEPO-MEDROL) injection 40 mg  . albuterol (PROVENTIL) (2.5 MG/3ML) 0.083% nebulizer solution 2.5 mg  . guaiFENesin-dextromethorphan (ROBITUSSIN DM) 100-10 MG/5ML syrup    Sig: Take 5 mLs by mouth every 8 (eight) hours as needed for cough.    Dispense:  118 mL    Refill:  0    Order Specific Question:   Supervising Provider    Answer:   Lucille Passy [[id number]]    Problem List Items Addressed This Visit      Other   Cough - Primary   Relevant Medications   albuterol (PROVENTIL HFA;VENTOLIN HFA) 108 (90 Base) MCG/ACT inhaler   benzonatate (TESSALON) 100 MG capsule   methylPREDNISolone acetate (DEPO-MEDROL) injection 40 mg (Completed)   albuterol (PROVENTIL) (2.5 MG/3ML) 0.083% nebulizer solution 2.5 mg (Completed)   guaiFENesin-dextromethorphan (ROBITUSSIN DM) 100-10 MG/5ML syrup    Other Visit Diagnoses    Seasonal allergic rhinitis, unspecified trigger       Relevant Medications   cetirizine  (ZYRTEC) 10 MG tablet      Follow-up: Return if symptoms worsen or fail to improve.  Wilfred Lacy, NP

## 2018-11-15 ENCOUNTER — Ambulatory Visit: Payer: BC Managed Care – PPO | Admitting: Nurse Practitioner

## 2018-11-15 DIAGNOSIS — Z0289 Encounter for other administrative examinations: Secondary | ICD-10-CM

## 2018-12-07 ENCOUNTER — Encounter: Payer: Self-pay | Admitting: Nurse Practitioner

## 2018-12-22 ENCOUNTER — Ambulatory Visit: Payer: BC Managed Care – PPO | Admitting: Gynecology

## 2018-12-22 ENCOUNTER — Encounter: Payer: Self-pay | Admitting: Gynecology

## 2018-12-22 VITALS — BP 124/82

## 2018-12-22 DIAGNOSIS — D250 Submucous leiomyoma of uterus: Secondary | ICD-10-CM

## 2018-12-22 NOTE — Patient Instructions (Signed)
Follow-up for the ultrasound as scheduled. 

## 2018-12-22 NOTE — Progress Notes (Signed)
    Kathryn Frye Jan 22, 1976 [id number]        43 y.o.  G1P0011 presents to discuss surgery.  She has a history of leiomyoma with evaluation in 2017 showing a submucous component discovered after spontaneously extruding an IUD.  Currently on oral contraceptives with good suppression of her menses.  We had talked about hysteroscopic resection of the submucous myoma in the past but due to insurance issues she never followed up on that but wants to consider it now.  Past medical history,surgical history, problem list, medications, allergies, family history and social history were all reviewed and documented in the EPIC chart.  Directed ROS with pertinent positives and negatives documented in the history of present illness/assessment and plan.  Exam: [name] assistant Vitals:   12/22/18 0949  BP: 124/82   General appearance:  Normal Abdomen soft nontender without masses guarding rebound Pelvic external BUS vagina normal.  Cervix normal.  Uterus bulky 10 weeks size midline mobile nontender.  Adnexa without masses or tenderness  Assessment/Plan:  43 y.o. G1P0011 with history of leiomyoma with a submucous component.  On continuous oral contraceptive menstrual suppression doing well.  Wants to consider having myoma resected now.  We also discussed possible BTL at the same time.  Recommended re-looking at the uterus ultrasonographically to include sonohysterogram to reassess the anatomy.  Patient will schedule in follow-up for this.    Anastasio Auerbach MD, 10:01 AM 12/22/2018

## 2018-12-28 ENCOUNTER — Encounter (HOSPITAL_COMMUNITY): Payer: Self-pay

## 2018-12-28 ENCOUNTER — Emergency Department (HOSPITAL_COMMUNITY): Payer: BC Managed Care – PPO

## 2018-12-28 ENCOUNTER — Emergency Department (HOSPITAL_COMMUNITY)
Admission: EM | Admit: 2018-12-28 | Discharge: 2018-12-29 | Disposition: A | Discharge status: Home / Self Care | Payer: BC Managed Care – PPO | Attending: Emergency Medicine | Admitting: Emergency Medicine

## 2018-12-28 ENCOUNTER — Other Ambulatory Visit: Payer: Self-pay

## 2018-12-28 DIAGNOSIS — M25519 Pain in unspecified shoulder: Secondary | ICD-10-CM | POA: Insufficient documentation

## 2018-12-28 DIAGNOSIS — I10 Essential (primary) hypertension: Secondary | ICD-10-CM | POA: Diagnosis not present

## 2018-12-28 DIAGNOSIS — R072 Precordial pain: Secondary | ICD-10-CM

## 2018-12-28 DIAGNOSIS — M546 Pain in thoracic spine: Secondary | ICD-10-CM | POA: Diagnosis present

## 2018-12-28 LAB — CBC WITH DIFFERENTIAL/PLATELET
ABS IMMATURE GRANULOCYTES: 0.04 10*3/uL (ref 0.00–0.07)
Basophils Absolute: 0.1 10*3/uL (ref 0.0–0.1)
Basophils Relative: 1 %
Eosinophils Absolute: 0 10*3/uL (ref 0.0–0.5)
Eosinophils Relative: 0 %
HCT: 30.2 % — ABNORMAL LOW (ref 36.0–46.0)
Hemoglobin: 8.8 g/dL — ABNORMAL LOW (ref 12.0–15.0)
Immature Granulocytes: 0 %
Lymphocytes Relative: 15 %
Lymphs Abs: 1.8 10*3/uL (ref 0.7–4.0)
MCH: 23.3 pg — ABNORMAL LOW (ref 26.0–34.0)
MCHC: 29.1 g/dL — ABNORMAL LOW (ref 30.0–36.0)
MCV: 79.9 fL — ABNORMAL LOW (ref 80.0–100.0)
MONO ABS: 1.1 10*3/uL — AB (ref 0.1–1.0)
Monocytes Relative: 9 %
NEUTROS ABS: 9.2 10*3/uL — AB (ref 1.7–7.7)
Neutrophils Relative %: 75 %
Platelets: 527 10*3/uL — ABNORMAL HIGH (ref 150–400)
RBC: 3.78 MIL/uL — ABNORMAL LOW (ref 3.87–5.11)
RDW: 17.2 % — ABNORMAL HIGH (ref 11.5–15.5)
WBC: 12.2 10*3/uL — ABNORMAL HIGH (ref 4.0–10.5)
nRBC: 0 % (ref 0.0–0.2)

## 2018-12-28 LAB — BASIC METABOLIC PANEL
Anion gap: 9 (ref 5–15)
BUN: 8 mg/dL (ref 6–20)
CO2: 21 mmol/L — ABNORMAL LOW (ref 22–32)
Calcium: 8.6 mg/dL — ABNORMAL LOW (ref 8.9–10.3)
Chloride: 105 mmol/L (ref 98–111)
Creatinine, Ser: 0.99 mg/dL (ref 0.44–1.00)
GFR calc Af Amer: >60 mL/min (ref >60)
GFR calc non Af Amer: >60 mL/min (ref >60)
Glucose, Bld: 95 mg/dL (ref 70–99)
Potassium: 3.8 mmol/L (ref 3.5–5.1)
Sodium: 135 mmol/L (ref 135–145)

## 2018-12-28 LAB — D-DIMER, QUANTITATIVE: D-Dimer, Quant: 0.59 ug/mL-FEU — ABNORMAL HIGH (ref 0.00–0.50)

## 2018-12-28 LAB — TROPONIN I: Troponin I: <0.03 ng/mL (ref <0.03)

## 2018-12-28 MED ORDER — METHOCARBAMOL 500 MG PO TABS: 500.0000 mg | ORAL_TABLET | Freq: Four times a day (QID) | ORAL | 0 refills | Status: DC | PRN

## 2018-12-28 MED ORDER — IOPAMIDOL (ISOVUE-370) INJECTION 76%
INTRAVENOUS | Status: AC
  Filled 2018-12-28: qty 100

## 2018-12-28 MED ORDER — IOPAMIDOL (ISOVUE-370) INJECTION 76%
100.0000 mL | Freq: Once | INTRAVENOUS | Status: AC | PRN
  Administered 2018-12-28: 100 mL via INTRAVENOUS

## 2018-12-28 MED ORDER — IBUPROFEN 400 MG PO TABS
400.0000 mg | ORAL_TABLET | Freq: Once | ORAL | Status: AC | PRN
  Administered 2018-12-28: 400 mg via ORAL
  Filled 2018-12-28: qty 1

## 2018-12-28 MED ORDER — METHOCARBAMOL 500 MG PO TABS
500.0000 mg | ORAL_TABLET | Freq: Once | ORAL | Status: AC
  Administered 2018-12-29: 500 mg via ORAL
  Filled 2018-12-28: qty 1

## 2018-12-28 NOTE — ED Notes (Signed)
Patient transported to CT 

## 2018-12-28 NOTE — ED Triage Notes (Signed)
Pt from home with ems for severe left sided back pain that radiates to her ribs, pt describes it as a spasm. Pt also hypertensive (210/118) with hx of same but has not taken her BP meds all week.

## 2018-12-28 NOTE — Discharge Instructions (Signed)

## 2018-12-29 NOTE — ED Notes (Signed)
Patient verbalizes understanding of discharge instructions. Opportunity for questioning and answers were provided. Armband removed by staff, pt discharged from ED.  

## 2018-12-29 NOTE — ED Provider Notes (Signed)
Emergency Department Provider Note   I have reviewed the triage vital signs and the nursing notes.   HISTORY  Chief Complaint Back Pain and Hypertension   HPI Kathryn Frye is a 43 y.o. female with PMH of asthma and HTN presents to the emergency department for evaluation of severe, left-sided backslash shoulder pain which began abruptly today.  She has also noticed elevated blood pressures but patient states that she has not taken her blood pressure medications at home for the past week.  She does have medication to take but states she has been "stubborn."  She intends to restart this medication.  She denies associated shortness of breath or pleuritic pain.  No modifying factors.  Patient is not able to reproduce the pain with movement.  No similar pain in the past.  No productive cough, fevers, chills.   Past Medical History:  Diagnosis Date  . Anemia   . Asthma   . Heartburn    takes OTC  . Hypertension   . LGSIL of cervix of undetermined significance 05/2018   Colposcopy normal.  ECC with atypical epithelium possible low-grade changes    Patient Active Problem List   Diagnosis Date Noted  . Cough 11/09/2018  . Elevated blood pressure reading 02/25/2017  . H/O bariatric surgery 12/06/2015    Past Surgical History:  Procedure Laterality Date  . COLONOSCOPY    . GALLBLADDER SURGERY    . HERNIA REPAIR    . LAPAROSCOPIC GASTRIC SLEEVE RESECTION     Allergies Shellfish allergy  Family History  Problem Relation Age of Onset  . Cancer Father        Bone  . Hypertension Maternal Grandmother   . Diabetes Paternal Grandmother   . Heart disease Paternal Grandmother   . Stroke Paternal Grandmother     Social History Social History   Tobacco Use  . Smoking status: Never Smoker  . Smokeless tobacco: Never Used  Substance Use Topics  . Alcohol use: Yes    Alcohol/week: 0.0 standard drinks    Comment: Social  . Drug use: No    Review of  Systems  Constitutional: No fever/chills Eyes: No visual changes. ENT: No sore throat. Cardiovascular: Positive posterior chest pain/shoulder pain. Respiratory: Denies shortness of breath. Gastrointestinal: No abdominal pain.  No nausea, no vomiting.  No diarrhea.  No constipation. Genitourinary: Negative for dysuria. Musculoskeletal: Positive shoulder/back/posterior chest pain.  Skin: Negative for rash. Neurological: Negative for headaches, focal weakness or numbness.  10-point ROS otherwise negative.  ____________________________________________   PHYSICAL EXAM:  VITAL SIGNS: ED Triage Vitals  Enc Vitals Group     BP 12/28/18 1708 (!) 166/115     Pulse Rate 12/28/18 1708 96     Resp 12/28/18 1708 20     Temp 12/28/18 1708 98.7 F (37.1 C)     Temp Source 12/28/18 1708 Oral     SpO2 12/28/18 1703 100 %     Pain Score 12/28/18 1705 10   Constitutional: Alert and oriented. Well appearing and in no acute distress. Eyes: Conjunctivae are normal.  Head: Atraumatic. Nose: No congestion/rhinnorhea. Mouth/Throat: Mucous membranes are moist.  Neck: No stridor.  Cardiovascular: Normal rate, regular rhythm. Good peripheral circulation. Grossly normal heart sounds.   Respiratory: Normal respiratory effort.  No retractions. Lungs CTAB. Gastrointestinal: Soft and nontender. No distention.  Musculoskeletal: No lower extremity tenderness nor edema. No gross deformities of extremities. Normal ROM of the left shoulder. No tenderness to palpation of the back.  Neurologic:  Normal speech and language. No gross focal neurologic deficits are appreciated.  Skin:  Skin is warm, dry and intact. No rash noted.  ____________________________________________   LABS (all labs ordered are listed, but only abnormal results are displayed)  Labs Reviewed  BASIC METABOLIC PANEL - Abnormal; Notable for the following components:      Result Value   CO2 21 (*)    Calcium 8.6 (*)    All other  components within normal limits  CBC WITH DIFFERENTIAL/PLATELET - Abnormal; Notable for the following components:   WBC 12.2 (*)    RBC 3.78 (*)    Hemoglobin 8.8 (*)    HCT 30.2 (*)    MCV 79.9 (*)    MCH 23.3 (*)    MCHC 29.1 (*)    RDW 17.2 (*)    Platelets 527 (*)    Neutro Abs 9.2 (*)    Monocytes Absolute 1.1 (*)    All other components within normal limits  D-DIMER, QUANTITATIVE (NOT AT [facility]) - Abnormal; Notable for the following components:   D-Dimer, Quant 0.59 (*)    All other components within normal limits  TROPONIN I   ____________________________________________  EKG   EKG Interpretation  Date/Time:  Tuesday December 28 2018 17:05:01 EST Ventricular Rate:  94 PR Interval:  124 QRS Duration: 66 QT Interval:  346 QTC Calculation: 432 R Axis:   58 Text Interpretation:  Normal sinus rhythm Normal ECG No STEMI.  Confirmed by Nanda Quinton [phone]) on 12/28/2018 9:23:39 PM       ____________________________________________  RADIOLOGY  Dg Chest 2 View  Result Date: 12/28/2018 CLINICAL DATA:  Upper back pain EXAM: CHEST - 2 VIEW COMPARISON:  None. FINDINGS: The heart size and mediastinal contours are within normal limits. Both lungs are clear. The visualized skeletal structures are unremarkable. IMPRESSION: No active cardiopulmonary disease. Electronically Signed   By: Ashley Royalty M.D.   On: 12/28/2018 17:55   Ct Angio Chest Pe W And/or Wo Contrast  Result Date: 12/28/2018 CLINICAL DATA:  Left-sided chest pain EXAM: CT ANGIOGRAPHY CHEST WITH CONTRAST TECHNIQUE: Multidetector CT imaging of the chest was performed using the standard protocol during bolus administration of intravenous contrast. Multiplanar CT image reconstructions and MIPs were obtained to evaluate the vascular anatomy. CONTRAST:  55 mL Isovue 370. COMPARISON:  Plain film from earlier in the same day. FINDINGS: Cardiovascular: Thoracic aorta demonstrates no aneurysmal dilatation or dissection. No  atherosclerotic calcifications are seen. No cardiac enlargement is noted. The pulmonary artery shows a normal branching pattern without filling defect to suggest pulmonary embolism. No coronary calcifications are noted. Mediastinum/Nodes: Thoracic inlet is within normal limits. No hilar or mediastinal adenopathy is noted. Lungs/Pleura: Azygos lobe is noted on the right. The lungs are well aerated without focal infiltrate or sizable effusion. No parenchymal nodules are seen. Upper Abdomen: Visualized upper abdomen shows evidence of gastric sleeve resection consistent with the given clinical history. Mild hiatal hernia is noted. Musculoskeletal: No acute bony abnormality is noted. Review of the MIP images confirms the above findings. IMPRESSION: No evidence of pulmonary emboli. No acute abnormality is noted. Electronically Signed   By: Inez Catalina M.D.   On: 12/28/2018 23:09    ____________________________________________   PROCEDURES  Procedure(s) performed:   Procedures  None  ____________________________________________   INITIAL IMPRESSION / ASSESSMENT AND PLAN / ED COURSE  Pertinent labs & imaging results that were available during my care of the patient were reviewed by me and considered in my medical  decision making (see chart for details).  Patient presents to the emergency department for evaluation of severe, left backslash shoulder pain.  Likely musculoskeletal but given the acute onset and inability to reproduce the pain on physical exam lab work including troponin and d-dimer were obtained.  D-dimer was mildly elevated and patient underwent CT scan of the chest to rule out PE.  This was reviewed and showed no evidence of PE or other infiltrate.  The patient does plan to restart her blood pressure medications.  I see no findings on exam or lab work to suspect hypertensive emergency.  Provided Robaxin in the emergency department and plan for Tylenol/Motrin at home.  Patient is  comfortable with the plan of discharge and has a ride from the ED.   ____________________________________________  FINAL CLINICAL IMPRESSION(S) / ED DIAGNOSES  Final diagnoses:  Precordial chest pain  Acute shoulder pain, unspecified laterality     MEDICATIONS GIVEN DURING THIS VISIT:  Medications  iopamidol (ISOVUE-370) 76 % injection (has no administration in time range)  methocarbamol (ROBAXIN) tablet 500 mg (has no administration in time range)  ibuprofen (ADVIL,MOTRIN) tablet 400 mg (400 mg Oral Given 12/28/18 1713)  iopamidol (ISOVUE-370) 76 % injection 100 mL (100 mLs Intravenous Contrast Given 12/28/18 2249)     NEW OUTPATIENT MEDICATIONS STARTED DURING THIS VISIT:  New Prescriptions   METHOCARBAMOL (ROBAXIN) 500 MG TABLET    Take 1 tablet (500 mg total) by mouth every 6 (six) hours as needed for muscle spasms.    Note:  This document was prepared using Dragon voice recognition software and may include unintentional dictation errors.  Nanda Quinton, MD Emergency Medicine    Binnie Vonderhaar, [facility], MD 12/29/18 [phone]

## 2019-01-13 ENCOUNTER — Ambulatory Visit (INDEPENDENT_AMBULATORY_CARE_PROVIDER_SITE_OTHER): Payer: BC Managed Care – PPO

## 2019-01-13 ENCOUNTER — Other Ambulatory Visit: Payer: Self-pay | Admitting: Gynecology

## 2019-01-13 ENCOUNTER — Encounter: Payer: Self-pay | Admitting: Gynecology

## 2019-01-13 ENCOUNTER — Ambulatory Visit: Payer: BC Managed Care – PPO | Admitting: Gynecology

## 2019-01-13 VITALS — BP 130/80

## 2019-01-13 DIAGNOSIS — D251 Intramural leiomyoma of uterus: Secondary | ICD-10-CM

## 2019-01-13 DIAGNOSIS — D250 Submucous leiomyoma of uterus: Secondary | ICD-10-CM

## 2019-01-13 DIAGNOSIS — D252 Subserosal leiomyoma of uterus: Secondary | ICD-10-CM

## 2019-01-13 DIAGNOSIS — N840 Polyp of corpus uteri: Secondary | ICD-10-CM

## 2019-01-13 NOTE — Addendum Note (Signed)
Addended by: Nelva Nay on: 01/13/2019 03:43 PM   Modules accepted: Orders

## 2019-01-13 NOTE — Progress Notes (Signed)
    Kathryn Frye 03-28-1976 [id number]        43 y.o.  G1P0011 presents for sonohysterogram.  History of heavy menses and leiomyoma.  Sonohysterogram 2017 showed submucous component of 1 of the leiomyoma.  She had spontaneously extruded a Mirena IUD and had a ParaGard IUD that was difficult to remove.  Currently on oral contraceptives with good menstrual suppression although wants to consider having the submucous myoma removed now.  Past medical history,surgical history, problem list, medications, allergies, family history and social history were all reviewed and documented in the EPIC chart.  Directed ROS with pertinent positives and negatives documented in the history of present illness/assessment and plan.  Exam: Ivin Booty assistant Vitals:   01/13/19 1508  BP: 130/80   General appearance:  Normal Abdomen soft nontender without masses guarding rebound Pelvic external BUS vagina normal.  Cervix normal.  Uterus bulky 10 weeks size midline mobile nontender.  Adnexa without masses or tenderness  Ultrasound transvaginal shows uterus enlarged with multiple myomas largest measuring 32 x 29 with approximately 17 small myomas noted.  Endometrial echo 5.1 mm with distortion from the myomas.  Right and left ovaries normal.  Cul-de-sac negative.  Sonohysterogram performed, sterile technique, easy catheter introduction, adequate distention with posterior submucous myoma and questionable separate defect representing polyp.  Endometrial sample taken.  Patient tolerated well.  Assessment/Plan:  43 y.o. G1P0011 with multiple small myomas.  Submucous component on 1.  Questionable polyp also.  Options for management to include expectant with continued oral contraceptives assuming biopsy negative, hysteroscopic resection of the submucous component of the myoma recognizing we are not addressing the other myomas and may leave some of the submucous component in situ, hysteroscopy with resection and  laparoscopic BTL, hysterectomy which would address all the above.  The patient is not interested in hysterectomy.  She is debating between the hysteroscopy alone or hysteroscopy with BTL.  We will follow-up for the biopsy results and her decision as far as surgery.    Anastasio Auerbach MD, 3:28 PM 01/13/2019

## 2019-01-13 NOTE — Patient Instructions (Signed)
Office will call you with biopsy results and your decision as far as surgery.

## 2019-01-16 HISTORY — PX: THYROIDECTOMY: SHX17

## 2019-01-21 ENCOUNTER — Telehealth: Payer: Self-pay | Admitting: *Deleted

## 2019-01-21 ENCOUNTER — Encounter: Payer: Self-pay | Admitting: Gynecology

## 2019-01-21 MED ORDER — MISOPROSTOL 100 MCG PO TABS: 100.0000 ug | ORAL_TABLET | Freq: Once | ORAL | 0 refills | Status: AC

## 2019-01-21 NOTE — Telephone Encounter (Signed)
Per endo biopsy result, D&C Hyst Myosure resection polyp surgery is needed. Pt discussed BTL at same time but pt is choosing only to do the resection of endo polyp.  We discussed dates and financial responsibility pt wants to proceed. Surgery date 02/02/19 at 1130 at [facility].  All information given verbally and in letter mailed to her with a pamphlet for [facility] mailed along with financial responsibility letter.  Pre op apt 3/11 1220.  Cytotec 100 mcg 1 tab intravaginally the night before surgery sent to her [place].  Pt aware of all of the above. KW CMA

## 2019-01-26 ENCOUNTER — Ambulatory Visit: Payer: BC Managed Care – PPO | Admitting: Gynecology

## 2019-01-27 ENCOUNTER — Encounter: Payer: Self-pay | Admitting: Gynecology

## 2019-01-27 ENCOUNTER — Other Ambulatory Visit: Payer: Self-pay

## 2019-01-27 ENCOUNTER — Ambulatory Visit: Payer: BC Managed Care – PPO | Admitting: Gynecology

## 2019-01-27 ENCOUNTER — Encounter (HOSPITAL_BASED_OUTPATIENT_CLINIC_OR_DEPARTMENT_OTHER): Payer: Self-pay

## 2019-01-27 VITALS — BP 130/84

## 2019-01-27 DIAGNOSIS — D250 Submucous leiomyoma of uterus: Secondary | ICD-10-CM | POA: Diagnosis not present

## 2019-01-27 DIAGNOSIS — D251 Intramural leiomyoma of uterus: Secondary | ICD-10-CM | POA: Diagnosis not present

## 2019-01-27 DIAGNOSIS — D252 Subserosal leiomyoma of uterus: Secondary | ICD-10-CM

## 2019-01-27 NOTE — Progress Notes (Signed)
    Kathryn Frye 1976-03-08 [id number]        43 y.o.  G1P0011 presents for her preoperative consult upcoming hysteroscopic resection submucous myoma and possible polyp.  She has a history of leiomyoma with heavy menses.  Spontaneously extruded a Mirena IUD and had a very difficult removal of a ParaGard IUD.  Sonohysterogram previously showed a submucous component to the leiomyoma.  Due to insurance issues she did not proceed with resection at that time.  She was placed on low-dose oral contraceptives and has had acceptable menses but wants to proceed now with resection of the submucous component.  Recent follow-up sonohysterogram confirmed presence of a submucous myoma with questionable separate defect representing a polyp.  Endometrial sample taken suggests endometrial polyp.  The patient is proceeding with hysteroscopy D&C with resection of the submucous myoma and endometrial polyp.  Past medical history,surgical history, problem list, medications, allergies, family history and social history were all reviewed and documented in the EPIC chart.  Directed ROS with pertinent positives and negatives documented in the history of present illness/assessment and plan.  Exam: [name] assistant Vitals:   01/27/19 1503  BP: 130/84   General appearance:  Normal HEENT normal Lungs clear Cardiac regular rate no rubs murmurs or gallops Abdomen soft nontender without masses guarding rebound Pelvic external BUS vagina with menses flow.  Cervix normal.  Uterus bulky consistent with leiomyoma.  Adnexa without gross masses.  Assessment/Plan:  43 y.o. G1P0011 with leiomyoma to include submucous component and history of heavy menses.  Also separate area suggesting endometrial polyp with biopsy consistent with endometrial polyp for hysteroscopy D&C resection of endometrial defects.  I reviewed the proposed surgery with the patient to include the expected intraoperative and postoperative courses as well  as the recovery period. The use of the hysteroscope, resectoscope and the D&C portion were all discussed. The risks of surgery to include infection, prolonged antibiotics, hemorrhage necessitating transfusion and the risks of transfusion, including transfusion reaction, hepatitis, HIV, mad cow disease and other unknown entities were all discussed understood and accepted. The risk of damage to internal organs during the procedure, either immediately recognized or delay recognized, including vagina, cervix, uterus, possible perforation causing damage to bowel, bladder, ureters, vessels and nerves necessitating major exploratory reparative surgery and future reparative surgeries including bladder repair, ureteral damage repair, bowel resection, ostomy formation was also discussed understood and accepted.  She understands that we are not addressing the fibroids within the uterine wall but only those that protrude into the endometrial cavity.  We had also discussed and offered BTL at the same time but she declines this.  The patient's questions were answered to her satisfaction and she is ready to proceed with surgery.   Anastasio Auerbach MD, 3:38 PM 01/27/2019

## 2019-01-27 NOTE — Patient Instructions (Signed)
Followup for surgery as scheduled. 

## 2019-01-27 NOTE — H&P (Signed)
Kathryn Frye February 16, 1976 [id number]   History and Physical  Chief complaint: Leiomyoma, endometrial polyp  History of present illness: 43 y.o. G1P0011 with a history of leiomyoma with heavy menses.  Spontaneously extruded a Mirena IUD and had a very difficult removal of a ParaGard IUD.  Sonohysterogram previously showed a submucous component to the leiomyoma.  Due to insurance issues she did not proceed with resection at that time.  She was placed on low-dose oral contraceptives and has had acceptable menses but wants to proceed now with resection of the submucous component.  Recent follow-up sonohysterogram confirmed presence of a submucous myoma with questionable separate defect representing a polyp.  Endometrial sample taken suggests endometrial polyp.  The patient is proceeding with hysteroscopy D&C with resection of the submucous myoma and endometrial polyp.  Past Medical History:  Diagnosis Date  . Anemia   . Asthma   . Heartburn    takes OTC  . Hypertension   . LGSIL of cervix of undetermined significance 05/2018   Colposcopy normal.  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 COLONOSCOPY    . GALLBLADDER SURGERY    . HERNIA REPAIR    . LAPAROSCOPIC GASTRIC SLEEVE RESECTION      Family History  Problem Relation Age of Onset  . Cancer Father        Bone  . Hypertension Maternal Grandmother   . Diabetes Paternal Grandmother   . Heart disease Paternal Grandmother   . Stroke Paternal Grandmother     Social History:  reports that she has never smoked. She has never used smokeless tobacco. She reports current alcohol use. She reports that she does not use drugs.  Allergies:  Allergies  Allergen Reactions  . Shellfish Allergy Itching, Swelling and Rash    Itching , rash and throat swelling , no epi pen . No breathing problems never eats shrimp now    Medications: See Epic for the most current list  of medications.  ROS:  Was performed and pertinent positives and negatives are included in the history of present illness.  Exam: [name] assistant Vitals:   01/27/19 1503  BP: 130/84   General appearance:  Normal HEENT normal Lungs clear Cardiac regular rate no rubs murmurs or gallops Abdomen soft nontender without masses guarding rebound Pelvic external BUS vagina with menses flow.  Cervix normal.  Uterus bulky consistent with leiomyoma.  Adnexa without gross masses.    Assessment/Plan:  43 y.o. G1P0011 with leiomyoma to include submucous component and history of heavy menses.  Also separate area suggesting endometrial polyp with biopsy consistent with endometrial polyp for hysteroscopy D&C resection of endometrial defects.  I reviewed the proposed surgery with the patient to include the expected intraoperative and postoperative courses as well as the recovery period. The use of the hysteroscope, resectoscope and the D&C portion were all discussed. The risks of surgery to include infection, prolonged antibiotics, hemorrhage necessitating transfusion and the risks of transfusion, including transfusion reaction, hepatitis, HIV, mad cow disease and other unknown entities were all discussed understood and accepted. The risk of damage to internal organs during the procedure, either immediately recognized or delay recognized, including vagina, cervix, uterus, possible perforation causing damage to bowel, bladder, ureters, vessels and nerves necessitating major exploratory reparative surgery and future reparative surgeries including bladder repair, ureteral damage repair, bowel resection, ostomy formation was also discussed understood and accepted.  She understands that we are not addressing the  fibroids within the uterine wall but only those that protrude into the endometrial cavity.  We had also discussed and offered BTL at the same time but she declines this.  The patient's questions were  answered to her satisfaction and she is ready to proceed with surgery.    Anastasio Auerbach MD, 4:02 PM 01/27/2019

## 2019-01-28 ENCOUNTER — Telehealth: Payer: Self-pay | Admitting: *Deleted

## 2019-01-28 NOTE — Telephone Encounter (Signed)
Surgery time changed from 1130 am to 1015 am at [facility] on 02/02/19 with TF. Left detailed message on pt's cell voicemail. KW CMA

## 2019-01-31 ENCOUNTER — Other Ambulatory Visit: Payer: Self-pay

## 2019-01-31 ENCOUNTER — Encounter (HOSPITAL_COMMUNITY)
Admission: RE | Admit: 2019-01-31 | Discharge: 2019-01-31 | Disposition: A | Discharge status: Home / Self Care | Payer: BC Managed Care – PPO | Source: Ambulatory Visit | Attending: Gynecology | Admitting: Gynecology

## 2019-01-31 DIAGNOSIS — Z01812 Encounter for preprocedural laboratory examination: Secondary | ICD-10-CM | POA: Insufficient documentation

## 2019-01-31 LAB — CBC
HCT: 30.4 % — ABNORMAL LOW (ref 36.0–46.0)
Hemoglobin: 8.9 g/dL — ABNORMAL LOW (ref 12.0–15.0)
MCH: 24.2 pg — AB (ref 26.0–34.0)
MCHC: 29.3 g/dL — ABNORMAL LOW (ref 30.0–36.0)
MCV: 82.6 fL (ref 80.0–100.0)
Platelets: 580 10*3/uL — ABNORMAL HIGH (ref 150–400)
RBC: 3.68 MIL/uL — ABNORMAL LOW (ref 3.87–5.11)
RDW: 18.3 % — ABNORMAL HIGH (ref 11.5–15.5)
WBC: 10.4 10*3/uL (ref 4.0–10.5)
nRBC: 0 % (ref 0.0–0.2)

## 2019-01-31 LAB — COMPREHENSIVE METABOLIC PANEL
ALT: 16 U/L (ref 0–44)
AST: 25 U/L (ref 15–41)
Albumin: 3.4 g/dL — ABNORMAL LOW (ref 3.5–5.0)
Alkaline Phosphatase: 78 U/L (ref 38–126)
Anion gap: 8 (ref 5–15)
BILIRUBIN TOTAL: 0.4 mg/dL (ref 0.3–1.2)
BUN: 20 mg/dL (ref 6–20)
CO2: 24 mmol/L (ref 22–32)
Calcium: 8.8 mg/dL — ABNORMAL LOW (ref 8.9–10.3)
Chloride: 104 mmol/L (ref 98–111)
Creatinine, Ser: 0.93 mg/dL (ref 0.44–1.00)
GFR calc Af Amer: >60 mL/min (ref >60)
GFR calc non Af Amer: >60 mL/min (ref >60)
Glucose, Bld: 86 mg/dL (ref 70–99)
POTASSIUM: 4.1 mmol/L (ref 3.5–5.1)
Sodium: 136 mmol/L (ref 135–145)
Total Protein: 7.2 g/dL (ref 6.5–8.1)

## 2019-01-31 LAB — HCG, SERUM, QUALITATIVE: Preg, Serum: NEGATIVE

## 2019-02-01 ENCOUNTER — Encounter (HOSPITAL_BASED_OUTPATIENT_CLINIC_OR_DEPARTMENT_OTHER): Payer: Self-pay | Admitting: Anesthesiology

## 2019-02-01 NOTE — Anesthesia Preprocedure Evaluation (Addendum)
Anesthesia Evaluation  Patient identified by MRN, date of birth, ID band Patient awake    Reviewed: Allergy & Precautions, NPO status , Patient's Chart, lab work & pertinent test results  History of Anesthesia Complications (+) PONV and history of anesthetic complications  Airway Mallampati: II  TM Distance: >3 FB Neck ROM: Full    Dental  (+) Caps, Missing, Poor Dentition   Pulmonary asthma ,    Pulmonary exam normal breath sounds clear to auscultation       Cardiovascular hypertension, Normal cardiovascular exam Rhythm:Regular Rate:Normal     Neuro/Psych negative neurological ROS  negative psych ROS   GI/Hepatic Neg liver ROS, GERD  Medicated and Controlled,Hx/o Gastric sleeve   Endo/Other  negative endocrine ROS  Renal/GU negative Renal ROS  negative genitourinary   Musculoskeletal negative musculoskeletal ROS (+)   Abdominal   Peds  Hematology  (+) anemia ,   Anesthesia Other Findings   Reproductive/Obstetrics Endometrial polyp                           Anesthesia Physical Anesthesia Plan  ASA: II  Anesthesia Plan: General   Post-op Pain Management:    Induction: Intravenous  PONV Risk Score and Plan: 4 or greater and Scopolamine patch - Pre-op, Midazolam, Dexamethasone, Ondansetron and Treatment may vary due to age or medical condition  Airway Management Planned: LMA  Additional Equipment:   Intra-op Plan:   Post-operative Plan: Extubation in OR  Informed Consent: I have reviewed the patients History and Physical, chart, labs and discussed the procedure including the risks, benefits and alternatives for the proposed anesthesia with the patient or authorized representative who has indicated his/her understanding and acceptance.     Dental advisory given  Plan Discussed with: CRNA and Surgeon  Anesthesia Plan Comments:        Anesthesia Quick Evaluation

## 2019-02-02 ENCOUNTER — Ambulatory Visit (HOSPITAL_BASED_OUTPATIENT_CLINIC_OR_DEPARTMENT_OTHER): Payer: BC Managed Care – PPO | Admitting: Anesthesiology

## 2019-02-02 ENCOUNTER — Ambulatory Visit (HOSPITAL_BASED_OUTPATIENT_CLINIC_OR_DEPARTMENT_OTHER)
Admission: RE | Admit: 2019-02-02 | Discharge: 2019-02-02 | Disposition: A | Discharge status: Home / Self Care | Payer: BC Managed Care – PPO | Attending: Gynecology | Admitting: Gynecology

## 2019-02-02 ENCOUNTER — Encounter (HOSPITAL_BASED_OUTPATIENT_CLINIC_OR_DEPARTMENT_OTHER): Payer: Self-pay

## 2019-02-02 ENCOUNTER — Encounter (HOSPITAL_BASED_OUTPATIENT_CLINIC_OR_DEPARTMENT_OTHER)
Admission: RE | Disposition: A | Discharge status: Home / Self Care | Payer: Self-pay | Source: Home / Self Care | Attending: Gynecology

## 2019-02-02 DIAGNOSIS — N840 Polyp of corpus uteri: Secondary | ICD-10-CM | POA: Insufficient documentation

## 2019-02-02 DIAGNOSIS — Z79899 Other long term (current) drug therapy: Secondary | ICD-10-CM | POA: Insufficient documentation

## 2019-02-02 DIAGNOSIS — Z793 Long term (current) use of hormonal contraceptives: Secondary | ICD-10-CM | POA: Diagnosis not present

## 2019-02-02 DIAGNOSIS — J45909 Unspecified asthma, uncomplicated: Secondary | ICD-10-CM | POA: Insufficient documentation

## 2019-02-02 DIAGNOSIS — D259 Leiomyoma of uterus, unspecified: Secondary | ICD-10-CM

## 2019-02-02 DIAGNOSIS — D250 Submucous leiomyoma of uterus: Secondary | ICD-10-CM | POA: Insufficient documentation

## 2019-02-02 DIAGNOSIS — D649 Anemia, unspecified: Secondary | ICD-10-CM | POA: Insufficient documentation

## 2019-02-02 DIAGNOSIS — N920 Excessive and frequent menstruation with regular cycle: Secondary | ICD-10-CM | POA: Insufficient documentation

## 2019-02-02 DIAGNOSIS — D219 Benign neoplasm of connective and other soft tissue, unspecified: Secondary | ICD-10-CM

## 2019-02-02 DIAGNOSIS — I10 Essential (primary) hypertension: Secondary | ICD-10-CM | POA: Diagnosis not present

## 2019-02-02 HISTORY — DX: Other specified postprocedural states: Z98.890

## 2019-02-02 HISTORY — DX: Nausea with vomiting, unspecified: R11.2

## 2019-02-02 HISTORY — DX: Polyp of corpus uteri: N84.0

## 2019-02-02 HISTORY — DX: Leiomyoma of uterus, unspecified: D25.9

## 2019-02-02 HISTORY — DX: Personal history of colonic polyps: Z86.010

## 2019-02-02 HISTORY — DX: Personal history of other diseases of the digestive system: Z87.19

## 2019-02-02 HISTORY — PX: DILATATION & CURETTAGE/HYSTEROSCOPY WITH MYOSURE: SHX6511

## 2019-02-02 LAB — POCT PREGNANCY, URINE: Preg Test, Ur: NEGATIVE

## 2019-02-02 SURGERY — DILATATION & CURETTAGE/HYSTEROSCOPY WITH MYOSURE
Anesthesia: General

## 2019-02-02 MED ORDER — ONDANSETRON HCL 4 MG/2ML IJ SOLN
INTRAMUSCULAR | Status: AC
  Filled 2019-02-02: qty 2

## 2019-02-02 MED ORDER — SODIUM CHLORIDE 0.9 % IR SOLN
Status: DC | PRN
  Administered 2019-02-02: 3000 mL

## 2019-02-02 MED ORDER — KETOROLAC TROMETHAMINE 30 MG/ML IJ SOLN
INTRAMUSCULAR | Status: DC | PRN
  Administered 2019-02-02: 30 mg via INTRAVENOUS

## 2019-02-02 MED ORDER — LACTATED RINGERS IV SOLN
INTRAVENOUS | Status: DC
  Administered 2019-02-02: 09:00:00 via INTRAVENOUS
  Filled 2019-02-02: qty 1000

## 2019-02-02 MED ORDER — MIDAZOLAM HCL 5 MG/5ML IJ SOLN
INTRAMUSCULAR | Status: DC | PRN
  Administered 2019-02-02: 2 mg via INTRAVENOUS

## 2019-02-02 MED ORDER — PROPOFOL 10 MG/ML IV BOLUS
INTRAVENOUS | Status: DC | PRN
  Administered 2019-02-02: 160 mg via INTRAVENOUS
  Administered 2019-02-02: 40 mg via INTRAVENOUS

## 2019-02-02 MED ORDER — OXYCODONE HCL 5 MG PO TABS
5.0000 mg | ORAL_TABLET | Freq: Once | ORAL | Status: DC | PRN
  Filled 2019-02-02: qty 1

## 2019-02-02 MED ORDER — FENTANYL CITRATE (PF) 100 MCG/2ML IJ SOLN
INTRAMUSCULAR | Status: AC
  Filled 2019-02-02: qty 2

## 2019-02-02 MED ORDER — ONDANSETRON HCL 4 MG/2ML IJ SOLN
INTRAMUSCULAR | Status: DC | PRN
  Administered 2019-02-02: 4 mg via INTRAVENOUS

## 2019-02-02 MED ORDER — LIDOCAINE 2% (20 MG/ML) 5 ML SYRINGE
INTRAMUSCULAR | Status: DC | PRN
  Administered 2019-02-02: 100 mg via INTRAVENOUS

## 2019-02-02 MED ORDER — METOCLOPRAMIDE HCL 5 MG/ML IJ SOLN
10.0000 mg | Freq: Once | INTRAMUSCULAR | Status: DC | PRN
  Filled 2019-02-02: qty 2

## 2019-02-02 MED ORDER — LIDOCAINE 2% (20 MG/ML) 5 ML SYRINGE
INTRAMUSCULAR | Status: AC
  Filled 2019-02-02: qty 5

## 2019-02-02 MED ORDER — MIDAZOLAM HCL 2 MG/2ML IJ SOLN
INTRAMUSCULAR | Status: AC
  Filled 2019-02-02: qty 2

## 2019-02-02 MED ORDER — DEXAMETHASONE SODIUM PHOSPHATE 10 MG/ML IJ SOLN
INTRAMUSCULAR | Status: AC
  Filled 2019-02-02: qty 1

## 2019-02-02 MED ORDER — FENTANYL CITRATE (PF) 100 MCG/2ML IJ SOLN
INTRAMUSCULAR | Status: DC | PRN
  Administered 2019-02-02 (×2): 50 ug via INTRAVENOUS

## 2019-02-02 MED ORDER — SODIUM CHLORIDE 0.9 % IV SOLN
2.0000 g | INTRAVENOUS | Status: AC
  Administered 2019-02-02: 2 g via INTRAVENOUS
  Filled 2019-02-02: qty 2

## 2019-02-02 MED ORDER — LIDOCAINE HCL 1 % IJ SOLN
INTRAMUSCULAR | Status: DC | PRN
  Administered 2019-02-02: 10 mL

## 2019-02-02 MED ORDER — SILVER NITRATE-POT NITRATE 75-25 % EX MISC
CUTANEOUS | Status: DC | PRN
  Administered 2019-02-02: 3 via TOPICAL

## 2019-02-02 MED ORDER — SODIUM CHLORIDE 0.9 % IV SOLN
INTRAVENOUS | Status: AC
  Filled 2019-02-02: qty 2

## 2019-02-02 MED ORDER — PROPOFOL 10 MG/ML IV BOLUS
INTRAVENOUS | Status: AC
  Filled 2019-02-02: qty 20

## 2019-02-02 MED ORDER — OXYCODONE HCL 5 MG/5ML PO SOLN
5.0000 mg | Freq: Once | ORAL | Status: DC | PRN
  Filled 2019-02-02: qty 5

## 2019-02-02 MED ORDER — FENTANYL CITRATE (PF) 100 MCG/2ML IJ SOLN
25.0000 ug | INTRAMUSCULAR | Status: DC | PRN
  Filled 2019-02-02: qty 1

## 2019-02-02 MED ORDER — DEXAMETHASONE SODIUM PHOSPHATE 10 MG/ML IJ SOLN
INTRAMUSCULAR | Status: DC | PRN
  Administered 2019-02-02: 10 mg via INTRAVENOUS

## 2019-02-02 MED ORDER — MEPERIDINE HCL 25 MG/ML IJ SOLN
6.2500 mg | INTRAMUSCULAR | Status: DC | PRN
  Filled 2019-02-02: qty 1

## 2019-02-02 MED ORDER — KETOROLAC TROMETHAMINE 30 MG/ML IJ SOLN
INTRAMUSCULAR | Status: AC
  Filled 2019-02-02: qty 1

## 2019-02-02 SURGICAL SUPPLY — 20 items
CANISTER SUCT 3000ML PPV (MISCELLANEOUS) ×2 IMPLANT
CATH ROBINSON RED A/P 16FR (CATHETERS) ×2 IMPLANT
COVER WAND RF STERILE (DRAPES) ×2 IMPLANT
DEVICE MYOSURE LITE (MISCELLANEOUS) IMPLANT
DEVICE MYOSURE REACH (MISCELLANEOUS) IMPLANT
DILATOR CANAL MILEX (MISCELLANEOUS) IMPLANT
GAUZE 4X4 16PLY RFD (DISPOSABLE) ×2 IMPLANT
GLOVE BIO SURGEON STRL SZ7.5 (GLOVE) ×4 IMPLANT
GOWN STRL REUS W/TWL XL LVL3 (GOWN DISPOSABLE) ×2 IMPLANT
IV NS IRRIG 3000ML ARTHROMATIC (IV SOLUTION) ×2 IMPLANT
KIT PROCEDURE FLUENT (KITS) ×2 IMPLANT
KIT TURNOVER CYSTO (KITS) ×2 IMPLANT
MYOSURE XL FIBROID (MISCELLANEOUS) ×2
PACK VAGINAL MINOR WOMEN LF (CUSTOM PROCEDURE TRAY) ×2 IMPLANT
PAD OB MATERNITY 4.3X12.25 (PERSONAL CARE ITEMS) ×2 IMPLANT
SEAL ROD LENS SCOPE MYOSURE (ABLATOR) ×2 IMPLANT
SUT GUT CHROMIC 3 0 (SUTURE) ×1 IMPLANT
SYSTEM TISS REMOVAL MYOSURE XL (MISCELLANEOUS) IMPLANT
TOWEL OR 17X26 10 PK STRL BLUE (TOWEL DISPOSABLE) ×4 IMPLANT
WATER STERILE IRR 500ML POUR (IV SOLUTION) IMPLANT

## 2019-02-02 NOTE — Anesthesia Postprocedure Evaluation (Signed)
Anesthesia Post Note  Patient: Kathryn Frye  Procedure(s) Performed: [place] (N/A )     Patient location during evaluation: PACU Anesthesia Type: General Level of consciousness: awake and alert and oriented Pain management: pain level controlled Vital Signs Assessment: post-procedure vital signs reviewed and stable Respiratory status: spontaneous breathing, nonlabored ventilation and respiratory function stable Cardiovascular status: blood pressure returned to baseline and stable Postop Assessment: no apparent nausea or vomiting Anesthetic complications: no    Last Vitals:  Vitals:   02/02/19 1100 02/02/19 1108  BP: (!) 141/93 (!) 142/87  Pulse: 79 84  Resp: (!) 9 15  Temp:    SpO2: 98% 94%    Last Pain:  Vitals:   02/02/19 1108  TempSrc:   PainSc: 1                  Terance Pomplun A.

## 2019-02-02 NOTE — Op Note (Signed)
Kathryn Frye November 06, 1976 [id number]   Post Operative Note   Date of surgery:  02/02/2019  Pre Op Dx: Leiomyoma, endometrial polyp  Post Op Dx: Leiomyoma, endometrial polyp  Procedure: Hysteroscopy D&C, MyoSure resection submucous leiomyoma and endometrial polyp  Surgeon:  Belinda Block Antawn Sison  Anesthesia:  General  EBL: 25 cc  Distended media discrepancy: 973 cc saline  Complications:  None  Specimen: #1 submucous myoma and endometrial polyp fragments #2 endometrial curetting to pathology  Findings: EUA: External BUS vagina normal.  Cervix normal.  Uterus generous in size with mild irregularity consistent with history of leiomyoma.  Adnexa without gross masses   Hysteroscopy: Adequate noting fundus, anterior/posterior endometrial surfaces, right/left tubal ostia, lower uterine segment and endocervical canal all visualized.  Submucous myoma filling the posterior endometrial surface with approximately 60 to 70% within the cavity resected in its entirety.  Small endometrial polyp right lateral lower uterine segment resected in it's entirety  Procedure:  The patient was taken to the operating room, was placed in the low dorsal lithotomy position, underwent general anesthesia, received a perineal/vaginal preparation per nursing personnel and the bladder was emptied with an in and out Foley catheterization. The timeout was performed by the surgical team. An EUA was performed. The patient was draped in the usual fashion. The cervix was visualized with a speculum, anterior lip grasped with a single-tooth tenaculum and a paracervical block was placed using 10 cc's of 1% lidocaine. The cervix was gently dilated to admit the Myosure XL hysteroscope and hysteroscopy was performed with findings noted above. Using the Myosure XL resectoscopic wand the submucus myoma was resected in it's entirety through progressive passes of the hysteroscope to allow for more of the myoma to protrude within the cavity  with release of the pressure.  The small endometrial polyp was also resected in it's entirety to the level the surrounding endometrium. A gentle sharp curettage was performed. Both specimens were sent separately to pathology.  Repeat hysteroscopy showed an empty cavity with good distention and no evidence of perforation. The instruments were removed continued oozing was noted from the tenaculum site and ultimately a 3-0 chromic interrupted suture was placed to achieve ultimate hemostasis.  Subsequently hemostasis was visualized both at the tenaculum site and the external os.  The specimens were identified for pathology.  The sponge, needle and instrument count were verified correct. The patient was wanded per protocol. The patient was given intraoperative Toradol, was awakened without difficulty and was taken to the recovery room in good condition having tolerated the procedure well.   Anastasio Auerbach MD, 10:27 AM 02/02/2019

## 2019-02-02 NOTE — Transfer of Care (Signed)
Immediate Anesthesia Transfer of Care Note  Patient: Kathryn Frye  Procedure(s) Performed: DILATATION & CURETTAGE/HYSTEROSCOPY WITH MYOSURE (N/A )  Patient Location: PACU  Anesthesia Type:General  Level of Consciousness: awake, alert  and oriented  Airway & Oxygen Therapy: Patient Spontanous Breathing and Patient connected to nasal cannula oxygen  Post-op Assessment: Report given to RN  Post vital signs: Reviewed and stable  Last Vitals: 141/87, 86, 12, 100% Vitals Value Taken Time  BP    Temp    Pulse 87 02/02/2019 10:26 AM  Resp    SpO2 100 % 02/02/2019 10:26 AM  Vitals shown include unvalidated device data.  Last Pain:  Vitals:   02/02/19 0901  TempSrc:   PainSc: 0-No pain      Patients Stated Pain Goal: 6 (74/93/55 2174)  Complications: No apparent anesthesia complications

## 2019-02-02 NOTE — Anesthesia Procedure Notes (Signed)
Procedure Name: LMA Insertion Date/Time: 02/02/2019 9:36 AM Performed by: Bonney Aid, CRNA Pre-anesthesia Checklist: Patient identified, Emergency Drugs available, Suction available and Patient being monitored Patient Re-evaluated:Patient Re-evaluated prior to induction Oxygen Delivery Method: Circle system utilized Preoxygenation: Pre-oxygenation with 100% oxygen Induction Type: IV induction Ventilation: Mask ventilation without difficulty LMA: LMA inserted LMA Size: 4.0 Number of attempts: 1 Airway Equipment and Method: Bite block Placement Confirmation: positive ETCO2 Tube secured with: Tape Dental Injury: Teeth and Oropharynx as per pre-operative assessment

## 2019-02-02 NOTE — Discharge Instructions (Signed)
° °  Postoperative Instructions Hysteroscopy D & C  Dr. Phineas Real and the nursing staff have discussed postoperative instructions with you.  If you have any questions please ask them before you leave the hospital, or call Dr Elisabeth Most office at [phone].    We would like to emphasize the following instructions:   ? Call the office to make your follow-up appointment as recommended by Dr Phineas Real (usually 1-2 weeks).  ? You were given a prescription, or one was ordered for you at the pharmacy you designated.  Get that prescription filled and take the medication according to instructions.  ? You may eat a regular diet, but slowly until you start having bowel movements.  ? Drink plenty of water daily.  ? Nothing in the vagina (intercourse, douching, objects of any kind) for two weeks.  When reinitiating intercourse, if it is uncomfortable, stop and make an appointment with Dr Phineas Real to be evaluated.  ? No driving for one to two days until the effects of anesthesia has worn off.  No traveling out of town for several days.  ? You may shower, but no baths for one week.  Walking up and down stairs is ok.  No heavy lifting, prolonged standing, repeated bending or any working out until your post op check.  ? Rest frequently, listen to your body and do not push yourself and overdo it.  ? Call if:  o Your pain medication does not seem strong enough. o Worsening pain or abdominal bloating o Persistent nausea or vomiting o Difficulty with urination or bowel movements. o Temperature of 101 degrees or higher. o Heavy vaginal bleeding.  If your period is due, you may use tampons. o You have any questions or concerns    Do not take any nonsteroidal anti inflammatories (ibuprofen, Aleve, Motrin, Advil) until after 3:45 pm today.      Post Anesthesia Home Care Instructions  Activity: Get plenty of rest for the remainder of the day. A responsible individual must stay with you for 24  hours following the procedure.  For the next 24 hours, DO NOT: -Drive a car -Paediatric nurse -Drink alcoholic beverages -Take any medication unless instructed by your physician -Make any legal decisions or sign important papers.  Meals: Start with liquid foods such as gelatin or soup. Progress to regular foods as tolerated. Avoid greasy, spicy, heavy foods. If nausea and/or vomiting occur, drink only clear liquids until the nausea and/or vomiting subsides. Call your physician if vomiting continues.  Special Instructions/Symptoms: Your throat may feel dry or sore from the anesthesia or the breathing tube placed in your throat during surgery. If this causes discomfort, gargle with warm salt water. The discomfort should disappear within 24 hours.

## 2019-02-02 NOTE — H&P (Signed)
The patient was examined.  I reviewed the proposed surgery and consent form with the patient.  The dictated history and physical is current and accurate and all questions were answered. The patient is ready to proceed with surgery and has a realistic understanding and expectation for the outcome.   Anastasio Auerbach MD, 8:38 AM 02/02/2019

## 2019-02-03 ENCOUNTER — Encounter (HOSPITAL_BASED_OUTPATIENT_CLINIC_OR_DEPARTMENT_OTHER): Payer: Self-pay | Admitting: Gynecology

## 2019-08-09 ENCOUNTER — Encounter: Payer: Self-pay | Admitting: Gynecology

## 2019-08-11 ENCOUNTER — Other Ambulatory Visit: Payer: Self-pay

## 2019-08-11 DIAGNOSIS — Z20822 Contact with and (suspected) exposure to covid-19: Secondary | ICD-10-CM

## 2019-08-12 LAB — NOVEL CORONAVIRUS, NAA: SARS-CoV-2, NAA: NOT DETECTED

## 2019-08-15 ENCOUNTER — Telehealth: Payer: Self-pay | Admitting: General Practice

## 2019-08-15 NOTE — Telephone Encounter (Signed)
Pt aware covid lab test negative, not detected °

## 2020-01-16 ENCOUNTER — Telehealth: Payer: BC Managed Care – PPO | Admitting: Nurse Practitioner

## 2020-01-16 ENCOUNTER — Ambulatory Visit: Payer: BC Managed Care – PPO | Attending: Internal Medicine

## 2020-01-23 ENCOUNTER — Other Ambulatory Visit: Payer: Self-pay

## 2020-01-23 ENCOUNTER — Ambulatory Visit (INDEPENDENT_AMBULATORY_CARE_PROVIDER_SITE_OTHER): Payer: BC Managed Care – PPO | Admitting: Nurse Practitioner

## 2020-01-23 ENCOUNTER — Encounter: Payer: Self-pay | Admitting: Nurse Practitioner

## 2020-01-23 VITALS — BP 110/80 | HR 79 | Temp 97.9°F | Ht 63.0 in | Wt 155.0 lb

## 2020-01-23 DIAGNOSIS — I10 Essential (primary) hypertension: Secondary | ICD-10-CM

## 2020-01-23 DIAGNOSIS — D500 Iron deficiency anemia secondary to blood loss (chronic): Secondary | ICD-10-CM | POA: Diagnosis not present

## 2020-01-23 DIAGNOSIS — Z136 Encounter for screening for cardiovascular disorders: Secondary | ICD-10-CM | POA: Diagnosis not present

## 2020-01-23 DIAGNOSIS — Z0001 Encounter for general adult medical examination with abnormal findings: Secondary | ICD-10-CM

## 2020-01-23 DIAGNOSIS — Z13220 Encounter for screening for lipoid disorders: Secondary | ICD-10-CM

## 2020-01-23 LAB — CBC WITH DIFFERENTIAL/PLATELET
Basophils Absolute: 0 10*3/uL (ref 0.0–0.1)
Basophils Relative: 0.8 % (ref 0.0–3.0)
Eosinophils Absolute: 0.1 10*3/uL (ref 0.0–0.7)
Eosinophils Relative: 1 % (ref 0.0–5.0)
HCT: 26.8 % — ABNORMAL LOW (ref 36.0–46.0)
Hemoglobin: 8.5 g/dL — ABNORMAL LOW (ref 12.0–15.0)
Lymphocytes Relative: 24 % (ref 12.0–46.0)
Lymphs Abs: 1.4 10*3/uL (ref 0.7–4.0)
MCHC: 31.5 g/dL (ref 30.0–36.0)
MCV: 73.1 fl — ABNORMAL LOW (ref 78.0–100.0)
Monocytes Absolute: 0.5 10*3/uL (ref 0.1–1.0)
Monocytes Relative: 8.8 % (ref 3.0–12.0)
Neutro Abs: 3.8 10*3/uL (ref 1.4–7.7)
Neutrophils Relative %: 65.4 % (ref 43.0–77.0)
Platelets: 478 10*3/uL — ABNORMAL HIGH (ref 150.0–400.0)
RBC: 3.67 Mil/uL — ABNORMAL LOW (ref 3.87–5.11)
RDW: 19.6 % — ABNORMAL HIGH (ref 11.5–15.5)
WBC: 5.8 10*3/uL (ref 4.0–10.5)

## 2020-01-23 MED ORDER — AMLODIPINE BESYLATE 5 MG PO TABS: 5.0000 mg | ORAL_TABLET | Freq: Every day | ORAL | 3 refills | Status: DC

## 2020-01-23 NOTE — Progress Notes (Signed)
Subjective:    Patient ID: Kathryn Frye, female    DOB: 1976-04-08, 44 y.o.   MRN: [id number]  Patient presents today for complete physical and medication refill  HPI HTN: BP at goal with amlodipine. BP Readings from Last 3 Encounters:  01/23/20 110/80  02/02/19 (!) 114/92  01/27/19 130/84   Sexual History (orientation,birth control, marital status, STD):single sexually active, no need for STD screen, use of condoms, LMP 01/16/2020, up to date with PAP and mammogram per patient done by GYN.  Depression/Suicide: Depression screen [facility] 2/9 01/23/2020 05/21/2018 02/25/2017 10/13/2015  Decreased Interest 0 0 0 0  Down, Depressed, Hopeless 0 0 0 0  PHQ - 2 Score 0 0 0 0   Vision:up to date  Dental:up to date  Immunizations: (TDAP, Hep C screen, Pneumovax, Influenza, zoster)  Health Maintenance  Topic Date Due  . Flu Shot  02/15/2020*  . Tetanus Vaccine  01/22/2021*  . Pap Smear  05/18/2021  . HIV Screening  Completed  *Topic was postponed. The date shown is not the original due date.   Diet:regular.  Weight:  Wt Readings from Last 3 Encounters:  01/23/20 155 lb (70.3 kg)  02/02/19 150 lb 12.8 oz (68.4 kg)  11/09/18 152 lb 3.2 oz (69 kg)    Exercise:regular  Fall Risk: Fall Risk  01/23/2020 05/21/2018 02/25/2017  Falls in the past year? 0 No No  Number falls in past yr: 0 - -  Injury with Fall? 0 - -   Advanced Directive: Advanced Directives 02/02/2019  Does Patient Have a Medical Advance Directive? No  Would patient like information on creating a medical advance directive? Yes (MAU/Ambulatory/Procedural Areas - Information given)     Medications and allergies reviewed with patient and updated if appropriate.  Patient Active Problem List   Diagnosis Date Noted  . Essential hypertension 01/23/2020  . Iron deficiency anemia due to chronic blood loss 01/23/2020  . Cough 11/09/2018  . Elevated blood pressure reading 02/25/2017  . H/O bariatric surgery 12/06/2015     Current Outpatient Medications on File Prior to Visit  Medication Sig Dispense Refill  . albuterol (PROVENTIL HFA;VENTOLIN HFA) 108 (90 Base) MCG/ACT inhaler Inhale 1-2 puffs into the lungs every 6 (six) hours as needed. 1 Inhaler 0  . cetirizine (ZYRTEC) 10 MG tablet Take 1 tablet (10 mg total) by mouth at bedtime. (Patient taking differently: Take 10 mg by mouth as needed. ) 30 tablet 1  . fluticasone (FLONASE) 50 MCG/ACT nasal spray Place 2 sprays into both nostrils daily. (Patient taking differently: Place 2 sprays into both nostrils as needed. ) 16 g 12  . Multiple Vitamin (MULTIVITAMIN) capsule Take 1 capsule by mouth daily.     No current facility-administered medications on file prior to visit.    Past Medical History:  Diagnosis Date  . Anemia   . Asthma   . Endometrial polyp   . Heartburn    takes OTC  . History of colon polyps   . History of gallstones   . History of umbilical hernia   . Hypertension   . LGSIL of cervix of undetermined significance 05/2018   Colposcopy normal.  ECC with atypical epithelium possible low-grade changes  . PONV (postoperative nausea and vomiting)   . Uterine fibroid     Past Surgical History:  Procedure Laterality Date  . COLONOSCOPY W/ POLYPECTOMY    . DILATATION & CURETTAGE/HYSTEROSCOPY WITH MYOSURE N/A 02/02/2019   Procedure: [place];  Surgeon: Phineas Real,  Belinda Block, MD;  Location: [facility];  Service: Gynecology;  Laterality: N/A;  Myosure needed, requesting 1 hr to follow the 1015am case in our block time.  Marland Kitchen GALLBLADDER SURGERY    . HERNIA REPAIR     Umbilical  . LAPAROSCOPIC GASTRIC SLEEVE RESECTION    . THYROIDECTOMY  01/16/2019    Social History   Socioeconomic History  . Marital status: Single    Spouse name: Not on file  . Number of children: Not on file  . Years of education: Not on file  . Highest education level: Not on file  Occupational History  . Not  on file  Tobacco Use  . Smoking status: Never Smoker  . Smokeless tobacco: Never Used  Substance and Sexual Activity  . Alcohol use: Yes    Alcohol/week: 0.0 standard drinks    Comment: Social  . Drug use: No  . Sexual activity: Yes    Birth control/protection: Condom, Pill    Comment: 1st intercourse 82 yo-5 partners  Other Topics Concern  . Not on file  Social History Narrative  . Not on file   Social Determinants of Health   Financial Resource Strain:   . Difficulty of Paying Living Expenses: Not on file  Food Insecurity:   . Worried About Charity fundraiser in the Last Year: Not on file  . Ran Out of Food in the Last Year: Not on file  Transportation Needs:   . Lack of Transportation (Medical): Not on file  . Lack of Transportation (Non-Medical): Not on file  Physical Activity:   . Days of Exercise per Week: Not on file  . Minutes of Exercise per Session: Not on file  Stress:   . Feeling of Stress : Not on file  Social Connections:   . Frequency of Communication with Friends and Family: Not on file  . Frequency of Social Gatherings with Friends and Family: Not on file  . Attends Religious Services: Not on file  . Active Member of Clubs or Organizations: Not on file  . Attends Archivist Meetings: Not on file  . Marital Status: Not on file    Family History  Problem Relation Age of Onset  . Cancer Father        Bone  . Hypertension Maternal Grandmother   . Diabetes Paternal Grandmother   . Heart disease Paternal Grandmother   . Stroke Paternal Grandmother         Review of Systems  Constitutional: Negative for fever, malaise/fatigue and weight loss.  HENT: Negative for congestion and sore throat.   Eyes:       Negative for visual changes  Respiratory: Negative for cough and shortness of breath.   Cardiovascular: Negative for chest pain, palpitations and leg swelling.  Gastrointestinal: Negative for blood in stool, constipation, diarrhea and  heartburn.  Genitourinary: Negative for dysuria, frequency and urgency.  Musculoskeletal: Negative for falls, joint pain and myalgias.  Skin: Negative for rash.  Neurological: Negative for dizziness, sensory change and headaches.  Endo/Heme/Allergies: Does not bruise/bleed easily.  Psychiatric/Behavioral: Negative for depression, substance abuse and suicidal ideas. The patient is not nervous/anxious.     Objective:   Vitals:   01/23/20 1408  BP: 110/80  Pulse: 79  Temp: 97.9 F (36.6 C)  SpO2: 98%   Body mass index is 27.46 kg/m.  Physical Examination:  Physical Exam Vitals reviewed.  Constitutional:      General: She is not in acute distress.  Appearance: She is well-developed.  HENT:     Right Ear: Tympanic membrane, ear canal and external ear normal.     Left Ear: Tympanic membrane, ear canal and external ear normal.  Eyes:     Extraocular Movements: Extraocular movements intact.     Conjunctiva/sclera: Conjunctivae normal.  Cardiovascular:     Rate and Rhythm: Normal rate and regular rhythm.     Pulses: Normal pulses.     Heart sounds: Normal heart sounds.  Pulmonary:     Effort: Pulmonary effort is normal. No respiratory distress.     Breath sounds: Normal breath sounds.  Chest:     Chest wall: No tenderness.  Abdominal:     General: Bowel sounds are normal.     Palpations: Abdomen is soft.  Genitourinary:    Comments: deferred breast and pelvic exam deferred to GYN per patient Musculoskeletal:        General: Normal range of motion.     Cervical back: Normal range of motion and neck supple.     Right lower leg: No edema.     Left lower leg: No edema.  Lymphadenopathy:     Cervical: No cervical adenopathy.  Skin:    General: Skin is warm and dry.  Neurological:     Mental Status: She is alert and oriented to person, place, and time.     Deep Tendon Reflexes: Reflexes are normal and symmetric.  Psychiatric:        Mood and Affect: Mood normal.         Behavior: Behavior normal.        Thought Content: Thought content normal.    ASSESSMENT and PLAN: This visit occurred during the SARS-CoV-2 public health emergency.  Safety protocols were in place, including screening questions prior to the visit, additional usage of staff PPE, and extensive cleaning of exam room while observing appropriate contact time as indicated for disinfecting solutions.   Kathryn Frye was seen today for annual exam.  Diagnoses and all orders for this visit:  Encounter for preventative adult health care exam with abnormal findings -     TSH -     Comprehensive metabolic panel -     Lipid panel  Essential hypertension -     amLODipine (NORVASC) 5 MG tablet; Take 1 tablet (5 mg total) by mouth daily.  Iron deficiency anemia due to chronic blood loss -     CBC with Differential/Platelet -     Iron, TIBC and Ferritin Panel -     Ferrous Sulfate (IRON) 325 (65 Fe) MG TABS; Take 1 tablet (325 mg total) by mouth 3 (three) times daily after meals. -     Ambulatory referral to Hematology  Encounter for lipid screening for cardiovascular disease -     Lipid panel   No problem-specific Assessment & Plan notes found for this encounter.     Problem List Items Addressed This Visit      Cardiovascular and Mediastinum   Essential hypertension   Relevant Medications   amLODipine (NORVASC) 5 MG tablet     Other   Iron deficiency anemia due to chronic blood loss   Relevant Medications   Ferrous Sulfate (IRON) 325 (65 Fe) MG TABS   Other Relevant Orders   CBC with Differential/Platelet (Completed)   Iron, TIBC and Ferritin Panel (Completed)   Ambulatory referral to Hematology    Other Visit Diagnoses    Encounter for preventative adult health care exam with abnormal findings    -  Primary   Relevant Orders   TSH (Completed)   Comprehensive metabolic panel (Completed)   Lipid panel (Completed)   Encounter for lipid screening for cardiovascular disease        Relevant Orders   Lipid panel (Completed)      Follow up: Return in about 6 months (around 07/25/2020) for HTN (video appt).  Wilfred Lacy, NP

## 2020-01-23 NOTE — Patient Instructions (Addendum)
Thank you for choosing [facility] for your health needs.  Normal TSH and CMP. Severe iron deficient anemia. Need to resume ferrous sulfate TID and entered referral to hematology to discuss iron infusion  Have PAP and mammogram faxed to me once completed by GYN.   Health Maintenance, Female Adopting a healthy lifestyle and getting preventive care are important in promoting health and wellness. Ask your health care provider about:  The right schedule for you to have regular tests and exams.  Things you can do on your own to prevent diseases and keep yourself healthy. What should I know about diet, weight, and exercise? Eat a healthy diet   Eat a diet that includes plenty of vegetables, fruits, low-fat dairy products, and lean protein.  Do not eat a lot of foods that are high in solid fats, added sugars, or sodium. Maintain a healthy weight Body mass index (BMI) is used to identify weight problems. It estimates body fat based on height and weight. Your health care provider can help determine your BMI and help you achieve or maintain a healthy weight. Get regular exercise Get regular exercise. This is one of the most important things you can do for your health. Most adults should:  Exercise for at least 150 minutes each week. The exercise should increase your heart rate and make you sweat (moderate-intensity exercise).  Do strengthening exercises at least twice a week. This is in addition to the moderate-intensity exercise.  Spend less time sitting. Even light physical activity can be beneficial. Watch cholesterol and blood lipids Have your blood tested for lipids and cholesterol at 46 years of age, then have this test every 5 years. Have your cholesterol levels checked more often if:  Your lipid or cholesterol levels are high.  You are older than 46 years of age.  You are at high risk for heart disease. What should I know about cancer screening? Depending on your  health history and family history, you may need to have cancer screening at various ages. This may include screening for:  Breast cancer.  Cervical cancer.  Colorectal cancer.  Skin cancer.  Lung cancer. What should I know about heart disease, diabetes, and high blood pressure? Blood pressure and heart disease  High blood pressure causes heart disease and increases the risk of stroke. This is more likely to develop in people who have high blood pressure readings, are of African descent, or are overweight.  Have your blood pressure checked: ? Every 3-5 years if you are 71-52 years of age. ? Every year if you are 73 years old or older. Diabetes Have regular diabetes screenings. This checks your fasting blood sugar level. Have the screening done:  Once every three years after age 33 if you are at a normal weight and have a low risk for diabetes.  More often and at a younger age if you are overweight or have a high risk for diabetes. What should I know about preventing infection? Hepatitis B If you have a higher risk for hepatitis B, you should be screened for this virus. Talk with your health care provider to find out if you are at risk for hepatitis B infection. Hepatitis C Testing is recommended for:  Everyone born from [birth year] through 1967.  Anyone with known risk factors for hepatitis C. Sexually transmitted infections (STIs)  Get screened for STIs, including gonorrhea and chlamydia, if: ? You are sexually active and are younger than 46 years of age. ? You are older  than 46 years of age and your health care provider tells you that you are at risk for this type of infection. ? Your sexual activity has changed since you were last screened, and you are at increased risk for chlamydia or gonorrhea. Ask your health care provider if you are at risk.  Ask your health care provider about whether you are at high risk for HIV. Your health care provider may recommend a prescription  medicine to help prevent HIV infection. If you choose to take medicine to prevent HIV, you should first get tested for HIV. You should then be tested every 3 months for as long as you are taking the medicine. Pregnancy  If you are about to stop having your period (premenopausal) and you may become pregnant, seek counseling before you get pregnant.  Take 400 to 800 micrograms (mcg) of folic acid every day if you become pregnant.  Ask for birth control (contraception) if you want to prevent pregnancy. Osteoporosis and menopause Osteoporosis is a disease in which the bones lose minerals and strength with aging. This can result in bone fractures. If you are 48 years old or older, or if you are at risk for osteoporosis and fractures, ask your health care provider if you should:  Be screened for bone loss.  Take a calcium or vitamin D supplement to lower your risk of fractures.  Be given hormone replacement therapy (HRT) to treat symptoms of menopause. Follow these instructions at home: Lifestyle  Do not use any products that contain nicotine or tobacco, such as cigarettes, e-cigarettes, and chewing tobacco. If you need help quitting, ask your health care provider.  Do not use street drugs.  Do not share needles.  Ask your health care provider for help if you need support or information about quitting drugs. Alcohol use  Do not drink alcohol if: ? Your health care provider tells you not to drink. ? You are pregnant, may be pregnant, or are planning to become pregnant.  If you drink alcohol: ? Limit how much you use to 0-1 drink a day. ? Limit intake if you are breastfeeding.  Be aware of how much alcohol is in your drink. In the [place] one drink equals one 12 oz bottle of beer (355 mL), one 5 oz glass of wine (148 mL), or one 1 oz glass of hard liquor (44 mL). General instructions  Schedule regular health, dental, and eye exams.  Stay current with your vaccines.  Tell your health  care provider if: ? You often feel depressed. ? You have ever been abused or do not feel safe at home. Summary  Adopting a healthy lifestyle and getting preventive care are important in promoting health and wellness.  Follow your health care provider's instructions about healthy diet, exercising, and getting tested or screened for diseases.  Follow your health care provider's instructions on monitoring your cholesterol and blood pressure. This information is not intended to replace advice given to you by your health care provider. Make sure you discuss any questions you have with your health care provider. Document Revised: 10/27/2018 Document Reviewed: 10/27/2018 Elsevier Patient Education  2020 [employer].

## 2020-01-24 LAB — LIPID PANEL
Cholesterol: 174 mg/dL (ref 0–200)
HDL: 73.6 mg/dL (ref >39.00)
LDL Cholesterol: 89 mg/dL (ref 0–99)
NonHDL: 100.19
Total CHOL/HDL Ratio: 2
Triglycerides: 57 mg/dL (ref 0.0–149.0)
VLDL: 11.4 mg/dL (ref 0.0–40.0)

## 2020-01-24 LAB — COMPREHENSIVE METABOLIC PANEL
ALT: 9 U/L (ref 0–35)
AST: 16 U/L (ref 0–37)
Albumin: 3.6 g/dL (ref 3.5–5.2)
Alkaline Phosphatase: 80 U/L (ref 39–117)
BUN: 13 mg/dL (ref 6–23)
CO2: 27 mEq/L (ref 19–32)
Calcium: 9.2 mg/dL (ref 8.4–10.5)
Chloride: 104 mEq/L (ref 96–112)
Creatinine, Ser: 0.88 mg/dL (ref 0.40–1.20)
GFR: 83.67 mL/min (ref >60.00)
Glucose, Bld: 78 mg/dL (ref 70–99)
Potassium: 4.1 mEq/L (ref 3.5–5.1)
Sodium: 138 mEq/L (ref 135–145)
Total Bilirubin: 0.3 mg/dL (ref 0.2–1.2)
Total Protein: 6.8 g/dL (ref 6.0–8.3)

## 2020-01-24 LAB — TSH: TSH: 0.5 u[IU]/mL (ref 0.35–4.50)

## 2020-01-24 LAB — IRON,TIBC AND FERRITIN PANEL
%SAT: 3 % (calc) — ABNORMAL LOW (ref 16–45)
Ferritin: 5 ng/mL — ABNORMAL LOW (ref 16–232)
Iron: 12 ug/dL — ABNORMAL LOW (ref 40–190)
TIBC: 416 mcg/dL (calc) (ref 250–450)

## 2020-01-24 MED ORDER — IRON 325 (65 FE) MG PO TABS: 1.0000 | ORAL_TABLET | Freq: Three times a day (TID) | ORAL | 5 refills | Status: AC

## 2020-01-26 ENCOUNTER — Telehealth: Payer: Self-pay | Admitting: Hematology and Oncology

## 2020-01-26 NOTE — Telephone Encounter (Signed)
Scheduled per referral. Called and spoke with pt, confirmed 3/29 appt. Pt aware to arrive 15-30 mins early and to bring insurance card with photo ID

## 2020-02-13 ENCOUNTER — Other Ambulatory Visit: Payer: Self-pay

## 2020-02-13 ENCOUNTER — Inpatient Hospital Stay: Payer: BC Managed Care – PPO | Attending: Hematology and Oncology | Admitting: Hematology and Oncology

## 2020-02-13 ENCOUNTER — Inpatient Hospital Stay: Payer: BC Managed Care – PPO

## 2020-02-13 VITALS — BP 177/113 | HR 86 | Temp 98.3°F | Resp 20 | Ht 63.0 in | Wt 156.7 lb

## 2020-02-13 DIAGNOSIS — D500 Iron deficiency anemia secondary to blood loss (chronic): Secondary | ICD-10-CM | POA: Insufficient documentation

## 2020-02-13 DIAGNOSIS — N920 Excessive and frequent menstruation with regular cycle: Secondary | ICD-10-CM | POA: Diagnosis not present

## 2020-02-13 DIAGNOSIS — D259 Leiomyoma of uterus, unspecified: Secondary | ICD-10-CM | POA: Diagnosis not present

## 2020-02-13 DIAGNOSIS — K921 Melena: Secondary | ICD-10-CM | POA: Insufficient documentation

## 2020-02-13 DIAGNOSIS — J45909 Unspecified asthma, uncomplicated: Secondary | ICD-10-CM | POA: Diagnosis not present

## 2020-02-13 DIAGNOSIS — Z823 Family history of stroke: Secondary | ICD-10-CM | POA: Insufficient documentation

## 2020-02-13 DIAGNOSIS — Z8719 Personal history of other diseases of the digestive system: Secondary | ICD-10-CM | POA: Diagnosis not present

## 2020-02-13 DIAGNOSIS — I10 Essential (primary) hypertension: Secondary | ICD-10-CM | POA: Insufficient documentation

## 2020-02-13 DIAGNOSIS — Z79899 Other long term (current) drug therapy: Secondary | ICD-10-CM | POA: Diagnosis not present

## 2020-02-13 DIAGNOSIS — Z20822 Contact with and (suspected) exposure to covid-19: Secondary | ICD-10-CM | POA: Diagnosis not present

## 2020-02-13 DIAGNOSIS — Z9884 Bariatric surgery status: Secondary | ICD-10-CM

## 2020-02-13 DIAGNOSIS — Z8249 Family history of ischemic heart disease and other diseases of the circulatory system: Secondary | ICD-10-CM | POA: Diagnosis not present

## 2020-02-13 DIAGNOSIS — N840 Polyp of corpus uteri: Secondary | ICD-10-CM

## 2020-02-13 DIAGNOSIS — Z833 Family history of diabetes mellitus: Secondary | ICD-10-CM | POA: Diagnosis not present

## 2020-02-13 DIAGNOSIS — Z596 Low income: Secondary | ICD-10-CM | POA: Diagnosis not present

## 2020-02-13 DIAGNOSIS — Z809 Family history of malignant neoplasm, unspecified: Secondary | ICD-10-CM

## 2020-02-13 LAB — CMP (CANCER CENTER ONLY)
ALT: 9 U/L (ref 0–44)
AST: 16 U/L (ref 15–41)
Albumin: 3.4 g/dL — ABNORMAL LOW (ref 3.5–5.0)
Alkaline Phosphatase: 91 U/L (ref 38–126)
Anion gap: 9 (ref 5–15)
BUN: 13 mg/dL (ref 6–20)
CO2: 24 mmol/L (ref 22–32)
Calcium: 8.9 mg/dL (ref 8.9–10.3)
Chloride: 106 mmol/L (ref 98–111)
Creatinine: 0.93 mg/dL (ref 0.44–1.00)
GFR, Est AFR Am: >60 mL/min (ref >60)
GFR, Estimated: >60 mL/min (ref >60)
Glucose, Bld: 83 mg/dL (ref 70–99)
Potassium: 4 mmol/L (ref 3.5–5.1)
Sodium: 139 mmol/L (ref 135–145)
Total Bilirubin: 0.3 mg/dL (ref 0.3–1.2)
Total Protein: 7.6 g/dL (ref 6.5–8.1)

## 2020-02-13 LAB — VITAMIN B12: Vitamin B-12: 183 pg/mL (ref 180–914)

## 2020-02-13 LAB — CBC WITH DIFFERENTIAL (CANCER CENTER ONLY)
Abs Immature Granulocytes: 0.03 10*3/uL (ref 0.00–0.07)
Basophils Absolute: 0.1 10*3/uL (ref 0.0–0.1)
Basophils Relative: 1 %
Eosinophils Absolute: 0.2 10*3/uL (ref 0.0–0.5)
Eosinophils Relative: 3 %
HCT: 31.9 % — ABNORMAL LOW (ref 36.0–46.0)
Hemoglobin: 9.3 g/dL — ABNORMAL LOW (ref 12.0–15.0)
Immature Granulocytes: 0 %
Lymphocytes Relative: 20 %
Lymphs Abs: 1.5 10*3/uL (ref 0.7–4.0)
MCH: 23.3 pg — ABNORMAL LOW (ref 26.0–34.0)
MCHC: 29.2 g/dL — ABNORMAL LOW (ref 30.0–36.0)
MCV: 79.8 fL — ABNORMAL LOW (ref 80.0–100.0)
Monocytes Absolute: 0.7 10*3/uL (ref 0.1–1.0)
Monocytes Relative: 10 %
Neutro Abs: 4.7 10*3/uL (ref 1.7–7.7)
Neutrophils Relative %: 66 %
Platelet Count: 486 10*3/uL — ABNORMAL HIGH (ref 150–400)
RBC: 4 MIL/uL (ref 3.87–5.11)
RDW: 20.8 % — ABNORMAL HIGH (ref 11.5–15.5)
WBC Count: 7.2 10*3/uL (ref 4.0–10.5)
nRBC: 0 % (ref 0.0–0.2)

## 2020-02-13 LAB — RETIC PANEL
Immature Retic Fract: 20.7 % — ABNORMAL HIGH (ref 2.3–15.9)
RBC.: 3.98 MIL/uL (ref 3.87–5.11)
Retic Count, Absolute: 58.9 10*3/uL (ref 19.0–186.0)
Retic Ct Pct: 1.5 % (ref 0.4–3.1)
Reticulocyte Hemoglobin: 27.2 pg — ABNORMAL LOW (ref >27.9)

## 2020-02-13 LAB — FERRITIN: Ferritin: 7 ng/mL — ABNORMAL LOW (ref 11–307)

## 2020-02-13 LAB — IRON AND TIBC
Iron: 8 ug/dL — ABNORMAL LOW (ref 41–142)
Saturation Ratios: 2 % — ABNORMAL LOW (ref 21–57)
TIBC: 473 ug/dL — ABNORMAL HIGH (ref 236–444)
UIBC: 465 ug/dL — ABNORMAL HIGH (ref 120–384)

## 2020-02-13 LAB — FOLATE: Folate: 10.6 ng/mL (ref >5.9)

## 2020-02-13 LAB — SAVE SMEAR(SSMR), FOR PROVIDER SLIDE REVIEW

## 2020-02-13 NOTE — Progress Notes (Signed)
[place] [phone]   [phone] [place] NOTE  Patient Care Team: Nche, Charlene Brooke, NP as PCP - General (Internal Medicine)  Hematological/Oncological History # Iron Deficiency Anemia 1) 12/06/2015: WBC 8.2, Hgb 11.7, Plt 418, MCV 97.8. First CBC on record.  2) 11/18/2016: WBC 7.6, Hgb 9.9, MCV 90, Plt 369 3) 12/28/2018: WBC 12.2, Hgb 8.8, MCV 79.9, Plt 527 4) 02/02/2019: Hysteroscopy D&C, MyoSure resection submucous leiomyoma and endometrial polyp 5) 01/23/2020: WBC 5.8, Hgb 8.5, MCV 73.1, Plt 478. Iron 12, TIBC 416, Iron Sat 3%, Ferritin 5 6) 02/13/2020: establish care with Dr. Lorenso Courier    CHIEF COMPLAINTS/PURPOSE OF CONSULTATION:  " Iron Deficiency Anemia "  HISTORY OF PRESENTING ILLNESS:  Kathryn Frye 44 y.o. female with medical history significant for uterine fibroids, HTN, asthma, and heartburn who presents for evaluation of iron deficiency anemia.   On review of the previous records Kathryn Frye has been anemic as far back as we have on record dating back to 12/06/2015 at which time she had a hemoglobin of 11.7.  On 11/18/2016 the patient was found to have a hemoglobin of 9.9 with an MCV of 90.  More recently in February 2020 the patient had a hemoglobin of 8.8.  In March 2020 the patient underwent hysteroscopy as well as resection of a subnew coastal leiomyoma and endometrial polyp.  She was intended to have an iron infusion, however the lockdown is occurred shortly thereafter due to the COVID-19 pandemic.  Most recently on 01/23/2020 the patient was found to have a hemoglobin 8.5, MCV of 73.1, iron sat of 3%, and a ferritin of 5.  Due to concern for this iron deficiency anemia the patient was referred to hematology for further evaluation management  On exam today Ms. Furth notes that she has low energy.  She reports that she also has been having ice cravings coinciding with her iron deficiency.  The patient endorses that prior to her  fibroid removal she had heavy menstrual cycles.  She notes that she had 7 to 10-day periods and that she would have numerous saturated pads per day and would pass big clots.  She notes that after her procedure in March 2020 her periods only lasted 3 to 7 days and were not nearly as heavy.  The patient notes that she was previously attempted on iron pills, however that these caused constipation and abdominal discomfort.  She also notes that they cause black stools at the time.  On further discussion the patient notes that she has an unrestricted diet and eats chicken and seafood, but tends not to eat as much in the way of red meat.  On further review the patient notes that she underwent gastric bypass surgery in 2014.  She also had her gallbladder removed in 2015 she is currently a non-smoker but drinks alcohol approximately once weekly.  She is currently a first grade teacher.  She notes that her family history is significant for her father having bone cancer her paternal grandmother potentially having sickle cell disease as well as her mother being anemic.  She also endorses that her paternal uncles and aunts had increased rates of sickle cell disease.  MEDICAL HISTORY:  Past Medical History:  Diagnosis Date  . Anemia   . Asthma   . Endometrial polyp   . Heartburn    takes OTC  . History of colon polyps   . History of gallstones   . History of umbilical hernia   . Hypertension   .  LGSIL of cervix of undetermined significance 05/2018   Colposcopy normal.  ECC with atypical epithelium possible low-grade changes  . PONV (postoperative nausea and vomiting)   . Uterine fibroid     SURGICAL HISTORY: Past Surgical History:  Procedure Laterality Date  . COLONOSCOPY W/ POLYPECTOMY    . DILATATION & CURETTAGE/HYSTEROSCOPY WITH MYOSURE N/A 02/02/2019   Procedure: [place];  Surgeon: Anastasio Auerbach, MD;  Location: [facility];  Service:  Gynecology;  Laterality: N/A;  Myosure needed, requesting 1 hr to follow the 1015am case in our block time.  Marland Kitchen GALLBLADDER SURGERY    . HERNIA REPAIR     Umbilical  . LAPAROSCOPIC GASTRIC SLEEVE RESECTION    . THYROIDECTOMY  01/16/2019    SOCIAL HISTORY: Social History   Socioeconomic History  . Marital status: Single    Spouse name: Not on file  . Number of children: Not on file  . Years of education: Not on file  . Highest education level: Not on file  Occupational History  . Not on file  Tobacco Use  . Smoking status: Never Smoker  . Smokeless tobacco: Never Used  Substance and Sexual Activity  . Alcohol use: Yes    Alcohol/week: 0.0 standard drinks    Comment: Social  . Drug use: No  . Sexual activity: Yes    Birth control/protection: Condom, Pill    Comment: 1st intercourse 20 yo-5 partners  Other Topics Concern  . Not on file  Social History Narrative  . Not on file   Social Determinants of Health   Financial Resource Strain:   . Difficulty of Paying Living Expenses:   Food Insecurity:   . Worried About Charity fundraiser in the Last Year:   . Arboriculturist in the Last Year:   Transportation Needs:   . Film/video editor (Medical):   Marland Kitchen Lack of Transportation (Non-Medical):   Physical Activity:   . Days of Exercise per Week:   . Minutes of Exercise per Session:   Stress:   . Feeling of Stress :   Social Connections:   . Frequency of Communication with Friends and Family:   . Frequency of Social Gatherings with Friends and Family:   . Attends Religious Services:   . Active Member of Clubs or Organizations:   . Attends Archivist Meetings:   Marland Kitchen Marital Status:   Intimate Partner Violence:   . Fear of Current or Ex-Partner:   . Emotionally Abused:   Marland Kitchen Physically Abused:   . Sexually Abused:     FAMILY HISTORY: Family History  Problem Relation Age of Onset  . Cancer Father        Bone  . Hypertension Maternal Grandmother   .  Diabetes Paternal Grandmother   . Heart disease Paternal Grandmother   . Stroke Paternal Grandmother     ALLERGIES:  is allergic to shellfish allergy.  MEDICATIONS:  Current Outpatient Medications  Medication Sig Dispense Refill  . ELDERBERRY PO Take 1 tablet by mouth daily.    Marland Kitchen albuterol (PROVENTIL HFA;VENTOLIN HFA) 108 (90 Base) MCG/ACT inhaler Inhale 1-2 puffs into the lungs every 6 (six) hours as needed. 1 Inhaler 0  . amLODipine (NORVASC) 5 MG tablet Take 1 tablet (5 mg total) by mouth daily. 90 tablet 3  . cetirizine (ZYRTEC) 10 MG tablet Take 1 tablet (10 mg total) by mouth at bedtime. (Patient taking differently: Take 10 mg by mouth as needed. )  30 tablet 1  . Ferrous Sulfate (IRON) 325 (65 Fe) MG TABS Take 1 tablet (325 mg total) by mouth 3 (three) times daily after meals. 90 tablet 5  . fluticasone (FLONASE) 50 MCG/ACT nasal spray Place 2 sprays into both nostrils daily. (Patient taking differently: Place 2 sprays into both nostrils as needed. ) 16 g 12  . Multiple Vitamin (MULTIVITAMIN) capsule Take 1 capsule by mouth daily.     No current facility-administered medications for this visit.    REVIEW OF SYSTEMS:   Constitutional: ( - ) fevers, ( - )  chills , ( - ) night sweats Eyes: ( - ) blurriness of vision, ( - ) double vision, ( - ) watery eyes Ears, nose, mouth, throat, and face: ( - ) mucositis, ( - ) sore throat Respiratory: ( - ) cough, ( - ) dyspnea, ( - ) wheezes Cardiovascular: ( - ) palpitation, ( - ) chest discomfort, ( - ) lower extremity swelling Gastrointestinal:  ( - ) nausea, ( - ) heartburn, ( - ) change in bowel habits Skin: ( - ) abnormal skin rashes Lymphatics: ( - ) new lymphadenopathy, ( - ) easy bruising Neurological: ( - ) numbness, ( - ) tingling, ( - ) new weaknesses Behavioral/Psych: ( - ) mood change, ( - ) new changes  All other systems were reviewed with the patient and are negative.  PHYSICAL EXAMINATION: ECOG PERFORMANCE STATUS: 1 -  Symptomatic but completely ambulatory  Vitals:   02/13/20 1321  BP: (!) 177/113  Pulse: 86  Resp: 20  Temp: 98.3 F (36.8 C)  SpO2: 100%   Filed Weights   02/13/20 1321  Weight: 156 lb 11.2 oz (71.1 kg)    GENERAL: well appearing middle aged [place] American female in NAD  SKIN: skin color, texture, turgor are normal, no rashes or significant lesions EYES: conjunctiva are pink and non-injected, sclera clear LUNGS: clear to auscultation and percussion with normal breathing effort HEART: regular rate & rhythm and no murmurs and no lower extremity edema ABDOMEN: soft, non-tender, non-distended, normal bowel sounds Musculoskeletal: no cyanosis of digits and no clubbing  PSYCH: alert & oriented x 3, fluent speech NEURO: no focal motor/sensory deficits  LABORATORY DATA:  I have reviewed the data as listed CBC Latest Ref Rng & Units 02/13/2020 01/23/2020 01/31/2019  WBC 4.0 - 10.5 K/uL 7.2 5.8 10.4  Hemoglobin 12.0 - 15.0 g/dL 9.3(L) 8.5 Repeated and verified X2.(L) 8.9(L)  Hematocrit 36.0 - 46.0 % 31.9(L) 26.8(L) 30.4(L)  Platelets 150 - 400 K/uL 486(H) 478.0(H) 580(H)    CMP Latest Ref Rng & Units 02/13/2020 01/23/2020 01/31/2019  Glucose 70 - 99 mg/dL 83 78 86  BUN 6 - 20 mg/dL '13 13 20  ' Creatinine 0.44 - 1.00 mg/dL 0.93 0.88 0.93  Sodium 135 - 145 mmol/L 139 138 136  Potassium 3.5 - 5.1 mmol/L 4.0 4.1 4.1  Chloride 98 - 111 mmol/L 106 104 104  CO2 22 - 32 mmol/L '24 27 24  ' Calcium 8.9 - 10.3 mg/dL 8.9 9.2 8.8(L)  Total Protein 6.5 - 8.1 g/dL 7.6 6.8 7.2  Total Bilirubin 0.3 - 1.2 mg/dL 0.3 0.3 0.4  Alkaline Phos 38 - 126 U/L 91 80 78  AST 15 - 41 U/L '16 16 25  ' ALT 0 - 44 U/L '9 9 16     ' PATHOLOGY: None relevant to review.   BLOOD FILM:  Review of the peripheral blood smear showed normal appearing white cells with neutrophils that  were appropriately lobated and granulated. There was no predominance of bi-lobed or hyper-segmented neutrophils appreciated. No Dohle bodies were  noted. There was no left shifting, immature forms or blasts noted. Lymphocytes remain normal in size without any predominance of large granular lymphocytes. Red cells show no anisopoikilocytosis, macrocytes , microcytes or polychromasia. There were no schistocytes, target cells, echinocytes, acanthocytes, dacrocytes, or stomatocytes.There was no rouleaux formation, nucleated red cells, or intra-cellular inclusions noted. The platelets increased in number, but are normal in size, shape, and color without any clumping evident.  RADIOGRAPHIC STUDIES: I have personally reviewed the radiological images as listed and agreed with the findings in the report. No results found.  ASSESSMENT & PLAN Kathryn Frye 44 y.o. female with medical history significant for uterine fibroids, HTN, asthma, and heartburn who presents for evaluation of iron deficiency anemia.  After review the labs and discussion with the patient her findings are most consistent with iron deficiency anemia secondary to chronic GI blood loss.  Given the severity of her iron deficiency and her prior attempts at p.o. iron therapy I would recommend that we transition this patient over to IV Feraheme instead.  We will plan to initiate therapy when we are able (the patient had a COVID exposure that came to our attention after her clinic visit).   In terms of p.o. iron therapy I would recommend that the patient take her p.o. iron no more than once daily with a source of vitamin C.  This helps to improve absorption and will potentially give the patient more benefit than multiple daily doses of iron.  There is no clear indication for GI evaluation and her Ob/Gyn bleeding has improved since her procedure in March 2020.  We will plan to see the patient back approximately 4 weeks after her last dose of IV iron to assure that her hemoglobin is improving and her iron levels are stabilized.  In the event that she remains anemic but the iron levels correct we  would need to consider further evaluation with a bone marrow biopsy.  # Iron Deficiency Anemia 2/2 to GYN Blood Loss --today will collect CBC, CMP, reticulocyte panel, and iron panel with ferritin for new baseline labs --based labwork from 01/23/2020 the patient will require therapy with IV iron --plan for IV feraheme 534m q7 days x 2 doses to start as soon as we are able (informed after clinic visit but prior to writing this note that patient must quarantine for 3 weeks due to COVID exposure). -- we will have the patient continue to take PO iron 3268m however we would recommend this daily with a source of vitamin C --source of GYN bleeding controlled, Hgb appears stable over the last year.  --we have the patient return to clinic 4 weeks after the last dose of IV iron to assure the Hgb is improving.   Orders Placed This Encounter  Procedures  . CBC with Differential (Cancer Center Only)    Standing Status:   Future    Number of Occurrences:   1    Standing Expiration Date:   02/12/2021  . Retic Panel    Standing Status:   Future    Number of Occurrences:   1    Standing Expiration Date:   02/12/2021  . Save Smear (SSMR)    Standing Status:   Future    Number of Occurrences:   1    Standing Expiration Date:   02/12/2021  . CMP ([facility])    Standing  Status:   Future    Number of Occurrences:   1    Standing Expiration Date:   02/12/2021  . Iron and TIBC    Standing Status:   Future    Number of Occurrences:   1    Standing Expiration Date:   02/12/2021  . Ferritin    Standing Status:   Future    Number of Occurrences:   1    Standing Expiration Date:   02/12/2021  . Vitamin B12    Standing Status:   Future    Number of Occurrences:   1    Standing Expiration Date:   02/12/2021  . Folate, Serum    Standing Status:   Future    Number of Occurrences:   1    Standing Expiration Date:   02/12/2021  . Methylmalonic acid, serum    Standing Status:   Future    Number of  Occurrences:   1    Standing Expiration Date:   02/12/2021  . Homocysteine, serum    Standing Status:   Future    Number of Occurrences:   1    Standing Expiration Date:   02/12/2021   All questions were answered. The patient knows to call the clinic with any problems, questions or concerns.  A total of more than 60 minutes were spent on this encounter and over half of that time was spent on counseling and coordination of care as outlined above.   Ledell Peoples, MD Department of Hematology/Oncology [place] at [facility] Phone: [phone] Pager: [phone] Email: [email or web address]  02/16/2020 5:27 PM

## 2020-02-14 ENCOUNTER — Telehealth: Payer: Self-pay | Admitting: *Deleted

## 2020-02-14 NOTE — Telephone Encounter (Signed)
Patient called to let us know that yesterday when she left she had to take her son to Urgent Care and he ended up testing positive for Covid.  She states they live together and they both are under quarnatine.  She was told yesterday that she will need to get Iron infusion next week, however, due to the quarnatine that will need to be adjusted.  Routed to Dr. Lorenso Courier to update his LOS and routed to his primary nurse as Juluis Rainier.

## 2020-02-16 ENCOUNTER — Encounter: Payer: Self-pay | Admitting: Hematology and Oncology

## 2020-02-16 LAB — HOMOCYSTEINE: Homocysteine: 10.2 umol/L (ref 0.0–14.5)

## 2020-02-18 LAB — METHYLMALONIC ACID, SERUM: Methylmalonic Acid, Quantitative: 153 nmol/L (ref 0–378)

## 2020-03-08 ENCOUNTER — Telehealth: Payer: Self-pay | Admitting: *Deleted

## 2020-03-08 NOTE — Telephone Encounter (Signed)
Received call from patient requesting to have her fereheme infusions scheduled.   She could not have those appts earlier in April due to her son having Covid 83.  Scheduling message sent for 2 fereheme infusions and for follow up appt with labs  4 weeks after her last infusion.  Dr. Lorenso Courier made aware of need for [facility] orders

## 2020-03-09 ENCOUNTER — Telehealth: Payer: Self-pay | Admitting: Hematology and Oncology

## 2020-03-09 NOTE — Telephone Encounter (Signed)
Scheduled apt per 4/22 sch message- spoke with pt and she is aware of apt date and time

## 2020-03-16 ENCOUNTER — Other Ambulatory Visit: Payer: Self-pay

## 2020-03-16 ENCOUNTER — Inpatient Hospital Stay: Payer: BC Managed Care – PPO | Attending: Hematology and Oncology

## 2020-03-16 VITALS — BP 165/112 | HR 81 | Temp 99.1°F | Resp 18

## 2020-03-16 DIAGNOSIS — I10 Essential (primary) hypertension: Secondary | ICD-10-CM | POA: Diagnosis not present

## 2020-03-16 DIAGNOSIS — N938 Other specified abnormal uterine and vaginal bleeding: Secondary | ICD-10-CM | POA: Insufficient documentation

## 2020-03-16 DIAGNOSIS — Z79899 Other long term (current) drug therapy: Secondary | ICD-10-CM | POA: Insufficient documentation

## 2020-03-16 DIAGNOSIS — R12 Heartburn: Secondary | ICD-10-CM | POA: Diagnosis not present

## 2020-03-16 DIAGNOSIS — D500 Iron deficiency anemia secondary to blood loss (chronic): Secondary | ICD-10-CM | POA: Insufficient documentation

## 2020-03-16 DIAGNOSIS — J449 Chronic obstructive pulmonary disease, unspecified: Secondary | ICD-10-CM | POA: Insufficient documentation

## 2020-03-16 MED ORDER — SODIUM CHLORIDE 0.9 % IV SOLN
Freq: Once | INTRAVENOUS | Status: AC
  Filled 2020-03-16: qty 250

## 2020-03-16 MED ORDER — SODIUM CHLORIDE 0.9 % IV SOLN
510.0000 mg | Freq: Once | INTRAVENOUS | Status: AC
  Administered 2020-03-16: 510 mg via INTRAVENOUS
  Filled 2020-03-16: qty 510

## 2020-03-16 NOTE — Patient Instructions (Signed)

## 2020-03-16 NOTE — Progress Notes (Signed)
Dr. Lorenso Courier aware of patient BP prior to [facility] infusion. Patient made this RN aware after feraheme that she did not take her Norvasc this morning but she has it with her here. Advised patient to take BP medication. Contacted Dr. Lorenso Courier and made him aware of patient post Feraheme BP and that she took her Norvasc for the day. Dr. Lorenso Courier okay with DC home after 30 min post observation period.

## 2020-03-23 ENCOUNTER — Inpatient Hospital Stay: Payer: BC Managed Care – PPO | Attending: Hematology and Oncology

## 2020-03-23 ENCOUNTER — Other Ambulatory Visit: Payer: Self-pay

## 2020-03-23 VITALS — BP 131/86 | HR 76 | Temp 99.1°F | Resp 20

## 2020-03-23 DIAGNOSIS — D500 Iron deficiency anemia secondary to blood loss (chronic): Secondary | ICD-10-CM | POA: Insufficient documentation

## 2020-03-23 DIAGNOSIS — K922 Gastrointestinal hemorrhage, unspecified: Secondary | ICD-10-CM | POA: Diagnosis not present

## 2020-03-23 MED ORDER — SODIUM CHLORIDE 0.9 % IV SOLN
Freq: Once | INTRAVENOUS | Status: AC
  Filled 2020-03-23: qty 250

## 2020-03-23 MED ORDER — SODIUM CHLORIDE 0.9 % IV SOLN
510.0000 mg | Freq: Once | INTRAVENOUS | Status: AC
  Administered 2020-03-23: 16:00:00 510 mg via INTRAVENOUS
  Filled 2020-03-23: qty 510

## 2020-03-23 NOTE — Patient Instructions (Signed)

## 2020-04-20 ENCOUNTER — Inpatient Hospital Stay: Payer: BC Managed Care – PPO | Attending: Hematology and Oncology | Admitting: Hematology and Oncology

## 2020-04-20 ENCOUNTER — Telehealth: Payer: Self-pay | Admitting: Hematology and Oncology

## 2020-04-20 ENCOUNTER — Inpatient Hospital Stay: Payer: BC Managed Care – PPO

## 2020-04-23 NOTE — Telephone Encounter (Signed)
Entered in error

## 2020-10-30 IMAGING — CT CT ANGIO CHEST
2 of 6 series · 19 of 36 positions shown · IV contrast (isovue)
Comparison: Plain film from earlier in the same day.

CLINICAL DATA: Left-sided chest pain

EXAM:
CT ANGIOGRAPHY CHEST WITH CONTRAST
TECHNIQUE: Multidetector CT imaging of the chest was performed using the
standard protocol during bolus administration of intravenous
contrast. Multiplanar CT image reconstructions and MIPs were
obtained to evaluate the vascular anatomy.
CONTRAST:  55 mL Isovue 370.

[Series 8: pe thins · axial · 0.73mm/px · z∈[-381,-163]mm · 18 of 348 slices shown]
[im 18/348  lung]
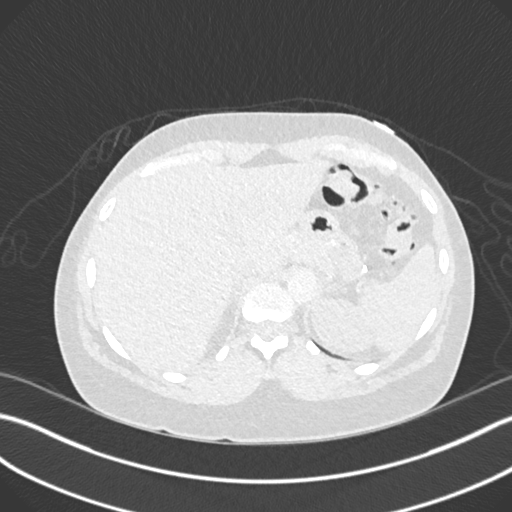
[im 35/348  mediastinal]
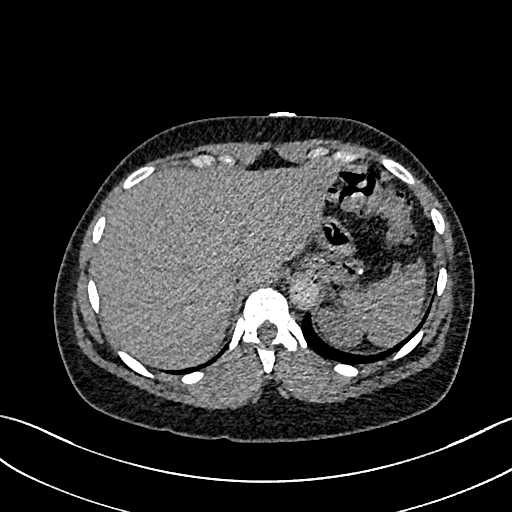
[im 53/348  lung]
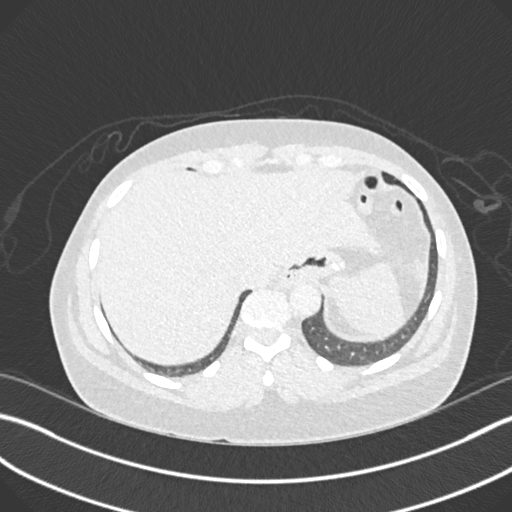
[im 70/348  mediastinal]
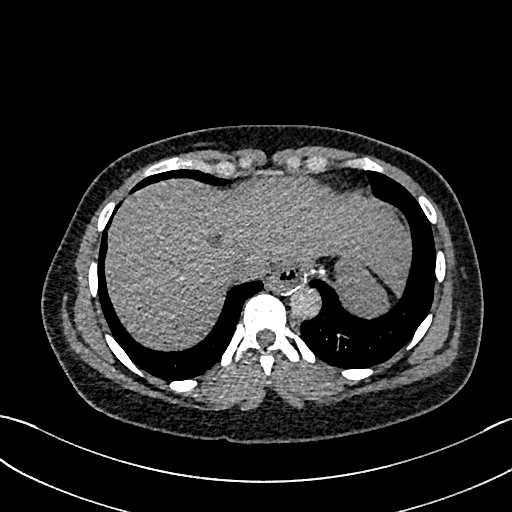
[im 87/348  lung]
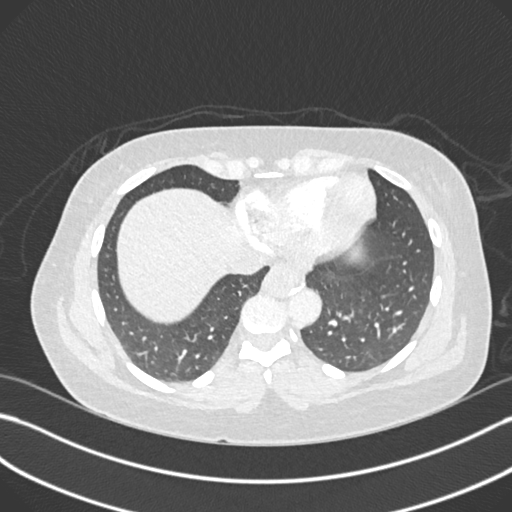
[im 105/348  mediastinal]
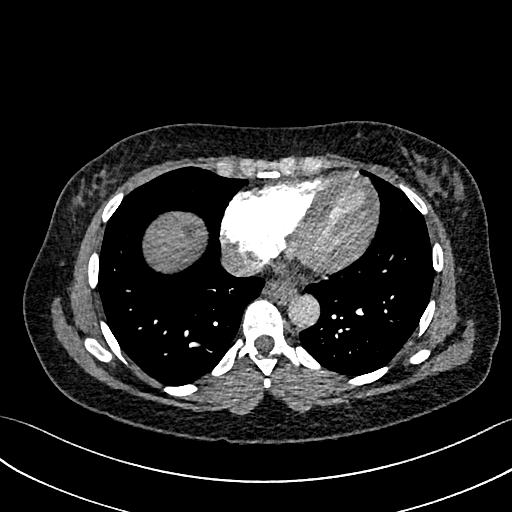
[im 122/348  lung]
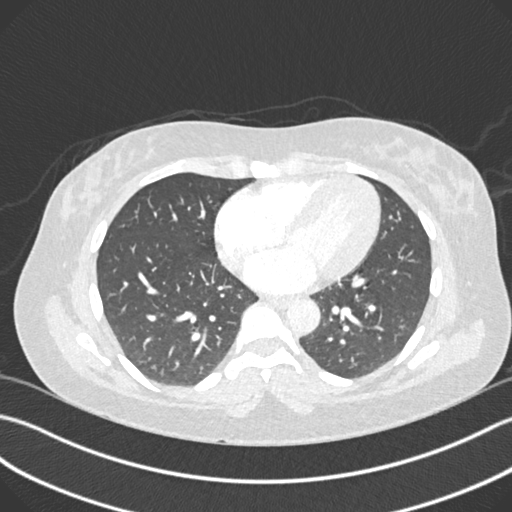
[im 139/348  mediastinal]
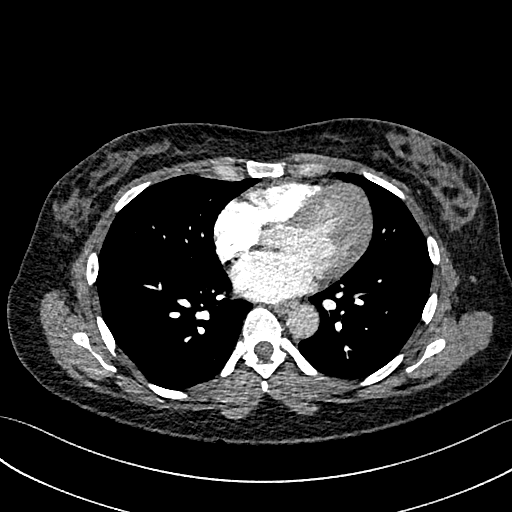
[im 157/348  lung]
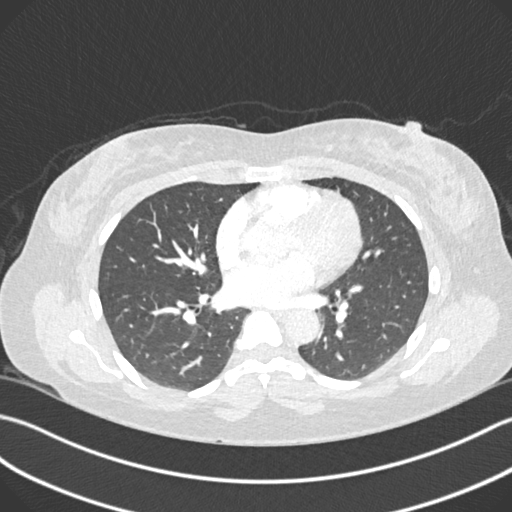
[im 191/348  mediastinal]
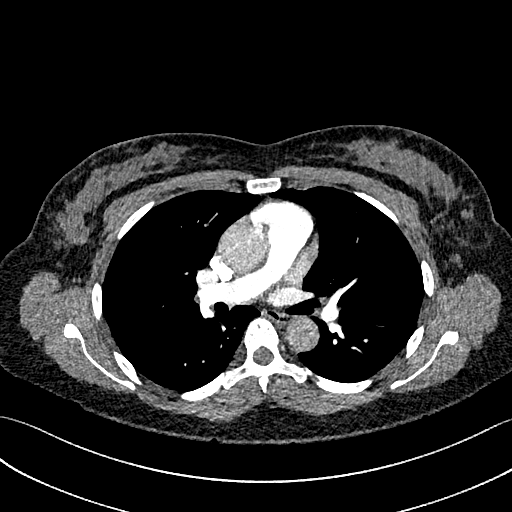
[im 209/348  lung]
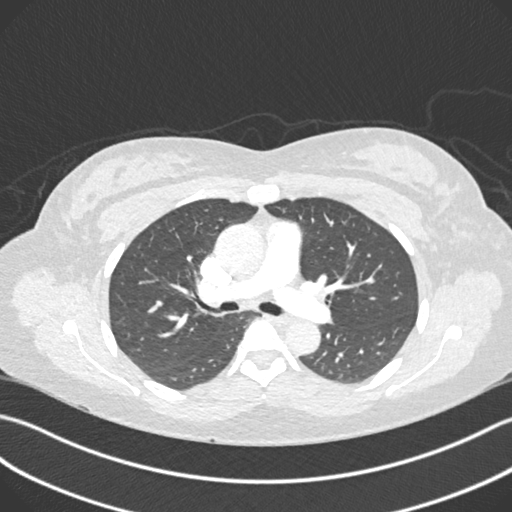
[im 226/348  mediastinal]
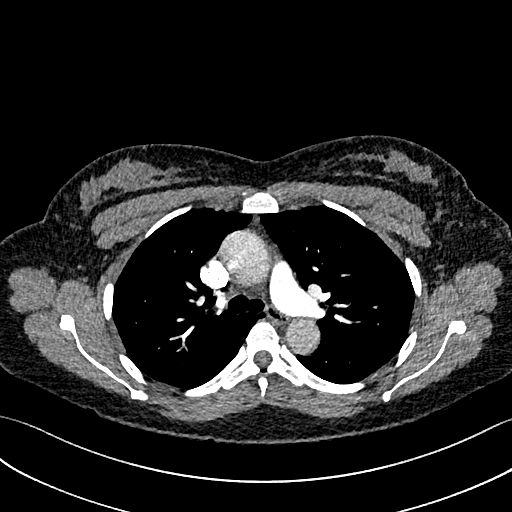
[im 243/348  lung]
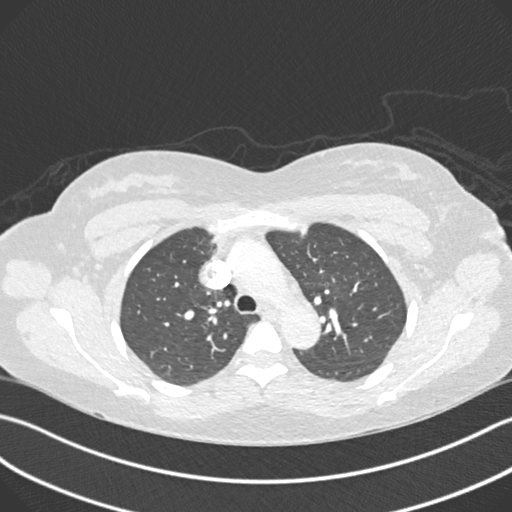
[im 261/348  mediastinal]
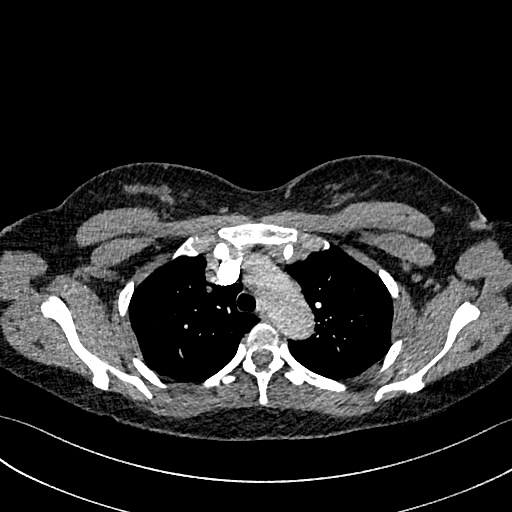
[im 278/348  lung]
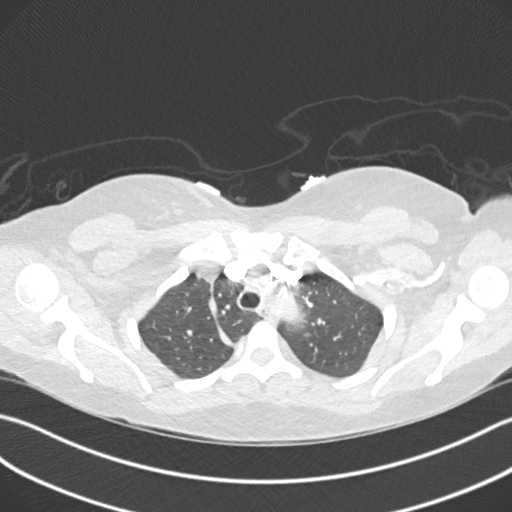
[im 295/348  mediastinal]
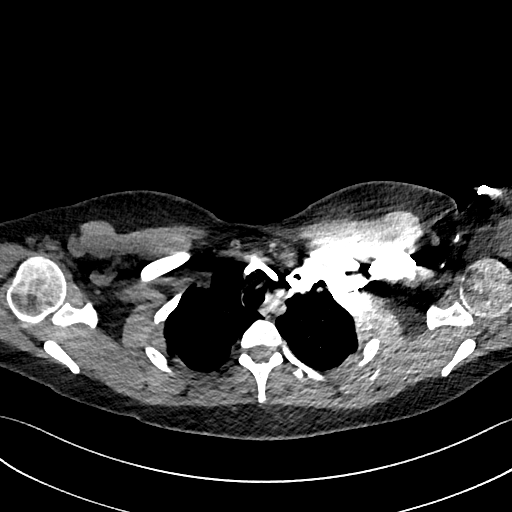
[im 313/348  lung]
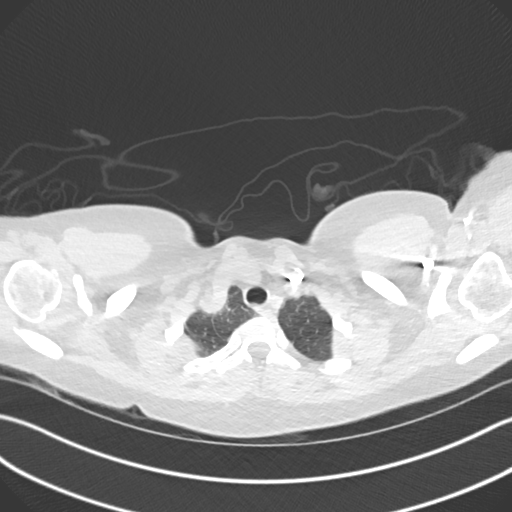
[im 330/348  mediastinal]
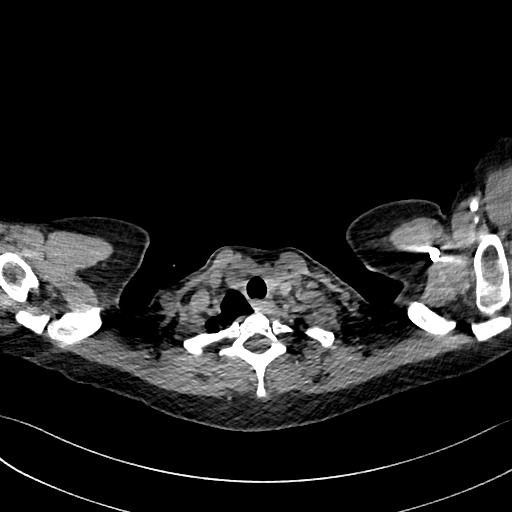

[Series 9: pe 2mm cor · coronal · 0.50mm/px · 1 of 135 slices shown]
[im 68/135  mediastinal]
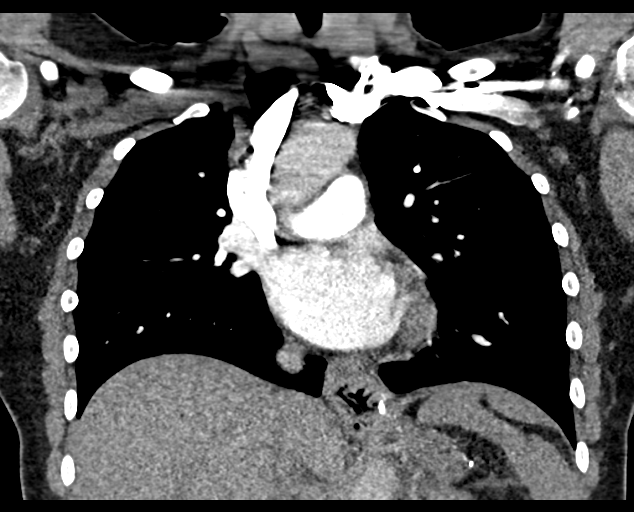

[19 of 36 positions shown; findings below may reference images not displayed]

FINDINGS: Cardiovascular: Thoracic aorta demonstrates no aneurysmal dilatation
or dissection. No atherosclerotic calcifications are seen. No
cardiac enlargement is noted. The pulmonary artery shows a normal
branching pattern without filling defect to suggest pulmonary
embolism. No coronary calcifications are noted.

Mediastinum/Nodes: Thoracic inlet is within normal limits. No hilar
or mediastinal adenopathy is noted.

Lungs/Pleura: Azygos lobe is noted on the right. The lungs are well
aerated without focal infiltrate or sizable effusion. No parenchymal
nodules are seen.

Upper Abdomen: Visualized upper abdomen shows evidence of gastric
sleeve resection consistent with the given clinical history. Mild
hiatal hernia is noted.

Musculoskeletal: No acute bony abnormality is noted.

Review of the MIP images confirms the above findings.
IMPRESSION: No evidence of pulmonary emboli.

No acute abnormality is noted.

## 2020-11-29 ENCOUNTER — Other Ambulatory Visit: Payer: Self-pay

## 2020-11-29 ENCOUNTER — Telehealth: Payer: Self-pay | Admitting: Nurse Practitioner

## 2020-11-29 DIAGNOSIS — Z1211 Encounter for screening for malignant neoplasm of colon: Secondary | ICD-10-CM

## 2020-11-29 NOTE — Telephone Encounter (Signed)
Patient would like an order placed for her colonoscopy. She said that she's overdue. She does not have a specific location that she would like to have it done. Please give her a call if you have any questions.

## 2020-11-29 NOTE — Telephone Encounter (Signed)
Order placed

## 2021-06-24 ENCOUNTER — Encounter: Payer: Self-pay | Admitting: Hematology and Oncology

## 2021-07-12 ENCOUNTER — Other Ambulatory Visit: Payer: Self-pay

## 2021-07-12 ENCOUNTER — Encounter: Payer: Self-pay | Admitting: Nurse Practitioner

## 2021-07-12 ENCOUNTER — Ambulatory Visit (INDEPENDENT_AMBULATORY_CARE_PROVIDER_SITE_OTHER): Payer: Self-pay | Admitting: Nurse Practitioner

## 2021-07-12 DIAGNOSIS — I10 Essential (primary) hypertension: Secondary | ICD-10-CM

## 2021-07-12 MED ORDER — AMLODIPINE BESYLATE 5 MG PO TABS: 5.0000 mg | ORAL_TABLET | Freq: Every day | ORAL | 3 refills | Status: AC

## 2021-07-12 NOTE — Progress Notes (Signed)
   Subjective:  Patient ID: Kathryn Frye, female    DOB: 07-Sep-1976  Age: 45 y.o. MRN: [id number]  CC: Follow-up (F/u on HTN, pt states she needs medication refills. )   HPI  Essential hypertension BP at goal with amlodipine BP Readings from Last 3 Encounters:  07/12/21 122/86  03/23/20 131/86  03/16/20 (!) 165/112   Med refill sent F/up in 71month for CPE   Reviewed past Medical, Social and Family history today.  Outpatient Medications Prior to Visit  Medication Sig Dispense Refill   albuterol (PROVENTIL HFA;VENTOLIN HFA) 108 (90 Base) MCG/ACT inhaler Inhale 1-2 puffs into the lungs every 6 (six) hours as needed. 1 Inhaler 0   cetirizine (ZYRTEC) 10 MG tablet Take 1 tablet (10 mg total) by mouth at bedtime. (Patient taking differently: Take 10 mg by mouth as needed.) 30 tablet 1   ELDERBERRY PO Take 1 tablet by mouth daily.     Ferrous Sulfate (IRON) 325 (65 Fe) MG TABS Take 1 tablet (325 mg total) by mouth 3 (three) times daily after meals. 90 tablet 5   fluticasone (FLONASE) 50 MCG/ACT nasal spray Place 2 sprays into both nostrils daily. (Patient taking differently: Place 2 sprays into both nostrils as needed.) 16 g 12   Multiple Vitamin (MULTIVITAMIN) capsule Take 1 capsule by mouth daily.     amLODipine (NORVASC) 5 MG tablet Take 1 tablet (5 mg total) by mouth daily. 90 tablet 3   No facility-administered medications prior to visit.    ROS See HPI  Objective:  BP 122/86 (BP Location: Left Arm, Patient Position: Sitting, Cuff Size: Normal)   Pulse 86   Temp 98.4 F (36.9 C) (Temporal)   Wt 160 lb (72.6 kg)   LMP 06/25/2021 (Approximate)   SpO2 98%   BMI 28.34 kg/m   Physical Exam Vitals reviewed.  Cardiovascular:     Rate and Rhythm: Normal rate and regular rhythm.     Pulses: Normal pulses.     Heart sounds: Normal heart sounds.  Pulmonary:     Effort: Pulmonary effort is normal.     Breath sounds: Normal breath sounds.  Musculoskeletal:     Right  lower leg: No edema.     Left lower leg: No edema.  Neurological:     Mental Status: She is alert and oriented to person, place, and time.    Assessment & Plan:  This visit occurred during the SARS-CoV-2 public health emergency.  Safety protocols were in place, including screening questions prior to the visit, additional usage of staff PPE, and extensive cleaning of exam room while observing appropriate contact time as indicated for disinfecting solutions.   LCelliewas seen today for follow-up.  Diagnoses and all orders for this visit:  Essential hypertension -     amLODipine (NORVASC) 5 MG tablet; Take 1 tablet (5 mg total) by mouth daily.   Problem List Items Addressed This Visit       Cardiovascular and Mediastinum   Essential hypertension    BP at goal with amlodipine BP Readings from Last 3 Encounters:  07/12/21 122/86  03/23/20 131/86  03/16/20 (!) 165/112   Med refill sent F/up in 239monthfor CPE      Relevant Medications   amLODipine (NORVASC) 5 MG tablet     Follow-up: No follow-ups on file.  ChWilfred LacyNP

## 2021-07-12 NOTE — Patient Instructions (Signed)
[facility] center in [place] upcoming appt for CPE. You need to be fasting 6-8hrs prior to appt. Ok to drink water and take amlodipine.

## 2021-07-12 NOTE — Assessment & Plan Note (Signed)
BP at goal with amlodipine BP Readings from Last 3 Encounters:  07/12/21 122/86  03/23/20 131/86  03/16/20 (!) 165/112   Med refill sent F/up in 54month for CPE

## 2021-07-31 ENCOUNTER — Ambulatory Visit: Payer: BC Managed Care – PPO | Admitting: Nurse Practitioner

## 2021-07-31 DIAGNOSIS — Z0289 Encounter for other administrative examinations: Secondary | ICD-10-CM

## 2021-08-22 ENCOUNTER — Encounter: Payer: Self-pay | Admitting: Nurse Practitioner

## 2021-10-03 ENCOUNTER — Emergency Department (HOSPITAL_BASED_OUTPATIENT_CLINIC_OR_DEPARTMENT_OTHER)
Admission: EM | Admit: 2021-10-03 | Discharge: 2021-10-04 | Disposition: A | Discharge status: Home / Self Care | Payer: BC Managed Care – PPO | Attending: Emergency Medicine | Admitting: Emergency Medicine

## 2021-10-03 ENCOUNTER — Encounter: Payer: Self-pay | Admitting: Hematology and Oncology

## 2021-10-03 ENCOUNTER — Encounter (HOSPITAL_BASED_OUTPATIENT_CLINIC_OR_DEPARTMENT_OTHER): Payer: Self-pay | Admitting: *Deleted

## 2021-10-03 ENCOUNTER — Other Ambulatory Visit: Payer: Self-pay

## 2021-10-03 ENCOUNTER — Emergency Department (HOSPITAL_BASED_OUTPATIENT_CLINIC_OR_DEPARTMENT_OTHER): Payer: BC Managed Care – PPO

## 2021-10-03 DIAGNOSIS — J45909 Unspecified asthma, uncomplicated: Secondary | ICD-10-CM | POA: Insufficient documentation

## 2021-10-03 DIAGNOSIS — Z79899 Other long term (current) drug therapy: Secondary | ICD-10-CM | POA: Diagnosis not present

## 2021-10-03 DIAGNOSIS — I10 Essential (primary) hypertension: Secondary | ICD-10-CM | POA: Diagnosis not present

## 2021-10-03 DIAGNOSIS — R079 Chest pain, unspecified: Secondary | ICD-10-CM | POA: Diagnosis present

## 2021-10-03 DIAGNOSIS — E876 Hypokalemia: Secondary | ICD-10-CM | POA: Insufficient documentation

## 2021-10-03 DIAGNOSIS — R0789 Other chest pain: Secondary | ICD-10-CM

## 2021-10-03 LAB — CBC
HCT: 37.4 % (ref 36.0–46.0)
Hemoglobin: 12.2 g/dL (ref 12.0–15.0)
MCH: 31.6 pg (ref 26.0–34.0)
MCHC: 32.6 g/dL (ref 30.0–36.0)
MCV: 96.9 fL (ref 80.0–100.0)
Platelets: 381 10*3/uL (ref 150–400)
RBC: 3.86 MIL/uL — ABNORMAL LOW (ref 3.87–5.11)
RDW: 13.7 % (ref 11.5–15.5)
WBC: 8.3 10*3/uL (ref 4.0–10.5)
nRBC: 0 % (ref 0.0–0.2)

## 2021-10-03 LAB — BASIC METABOLIC PANEL
Anion gap: 8 (ref 5–15)
BUN: 12 mg/dL (ref 6–20)
CO2: 27 mmol/L (ref 22–32)
Calcium: 8.7 mg/dL — ABNORMAL LOW (ref 8.9–10.3)
Chloride: 99 mmol/L (ref 98–111)
Creatinine, Ser: 0.94 mg/dL (ref 0.44–1.00)
GFR, Estimated: >60 mL/min (ref >60)
Glucose, Bld: 95 mg/dL (ref 70–99)
Potassium: 3.2 mmol/L — ABNORMAL LOW (ref 3.5–5.1)
Sodium: 134 mmol/L — ABNORMAL LOW (ref 135–145)

## 2021-10-03 LAB — PREGNANCY, URINE: Preg Test, Ur: NEGATIVE

## 2021-10-03 LAB — TROPONIN I (HIGH SENSITIVITY): Troponin I (High Sensitivity): 5 ng/L (ref <18)

## 2021-10-03 MED ORDER — IBUPROFEN 400 MG PO TABS
600.0000 mg | ORAL_TABLET | Freq: Once | ORAL | Status: AC
  Administered 2021-10-03: 23:00:00 600 mg via ORAL
  Filled 2021-10-03: qty 1

## 2021-10-03 MED ORDER — METHOCARBAMOL 500 MG PO TABS
750.0000 mg | ORAL_TABLET | Freq: Once | ORAL | Status: AC
  Administered 2021-10-03: 23:00:00 750 mg via ORAL
  Filled 2021-10-03: qty 2

## 2021-10-03 MED ORDER — METHOCARBAMOL 500 MG PO TABS: 500.0000 mg | ORAL_TABLET | Freq: Two times a day (BID) | ORAL | 0 refills | Status: AC

## 2021-10-03 MED ORDER — HYDRALAZINE HCL 20 MG/ML IJ SOLN
10.0000 mg | Freq: Once | INTRAMUSCULAR | Status: AC
  Administered 2021-10-03: 10 mg via INTRAVENOUS
  Filled 2021-10-03: qty 1

## 2021-10-03 NOTE — ED Provider Notes (Signed)
[facility] EMERGENCY DEPARTMENT Provider Note   CSN: [id number] Arrival date & time: 10/03/21  2208     History Chief Complaint  Patient presents with   Chest Pain    Kathryn Frye is a 45 y.o. female.  HPI Patient is a 45 year old female with past medical history significant for anemia, asthma, reflux, reflux, HTN  Patient presents to the emergency room today complaining of sternal chest pain that is achy and seems to be worse with certain movements.   No NV or SOB. No radiation of CP. States it went away between 6pm and 9:30 and then returned. She states she has no pain laying in bed currently.  She states that she has had a history of episodes of chest pain in the past  She has no history of first-degree relative of with early MI.  No history of smoking or recreational drug use denies any history of high cholesterol.  She states that she does have a history of hypertension has not been taking her amlodipine for approximately 1 month took 1 dose today.  HPI: A 45 year old patient with a history of hypertension presents for evaluation of chest pain. Initial onset of pain was approximately 1-3 hours ago. The patient's chest pain is sharp and is not worse with exertion. The patient's chest pain is not middle- or left-sided, is not well-localized, is not described as heaviness/pressure/tightness and does not radiate to the arms/jaw/neck. The patient does not complain of nausea and denies diaphoresis. The patient has no history of stroke, has no history of peripheral artery disease, has not smoked in the past 90 days, denies any history of treated diabetes, has no relevant family history of coronary artery disease (first degree relative at less than age 50), has no history of hypercholesterolemia and does not have an elevated BMI (>=30).   Past Medical History:  Diagnosis Date   Anemia    Asthma    Endometrial polyp    Heartburn    takes OTC   History of colon polyps     History of gallstones    History of umbilical hernia    Hypertension    LGSIL of cervix of undetermined significance 05/2018   Colposcopy normal.  ECC with atypical epithelium possible low-grade changes   PONV (postoperative nausea and vomiting)    Uterine fibroid     Patient Active Problem List   Diagnosis Date Noted   Essential hypertension 01/23/2020   Iron deficiency anemia due to chronic blood loss 01/23/2020   Cough 11/09/2018   Elevated blood pressure reading 02/25/2017   H/O bariatric surgery 12/06/2015    Past Surgical History:  Procedure Laterality Date   COLONOSCOPY W/ POLYPECTOMY     DILATATION & CURETTAGE/HYSTEROSCOPY WITH MYOSURE N/A 02/02/2019   Procedure: [place];  Surgeon: Anastasio Auerbach, MD;  Location: [facility];  Service: Gynecology;  Laterality: N/A;  Myosure needed, requesting 1 hr to follow the 1015am case in our block time.   GALLBLADDER SURGERY     HERNIA REPAIR     Umbilical   LAPAROSCOPIC GASTRIC SLEEVE RESECTION     THYROIDECTOMY  01/16/2019     OB History     Gravida  1   Para      Term      Preterm      AB  1   Living  1      SAB      IAB  Ectopic  0   Multiple      Live Births              Family History  Problem Relation Age of Onset   Cancer Father        Bone   Hypertension Maternal Grandmother    Diabetes Paternal Grandmother    Heart disease Paternal Grandmother    Stroke Paternal Grandmother     Social History   Tobacco Use   Smoking status: Never   Smokeless tobacco: Never  Vaping Use   Vaping Use: Never used  Substance Use Topics   Alcohol use: Yes    Alcohol/week: 0.0 standard drinks    Comment: Social   Drug use: No    Home Medications Prior to Admission medications   Medication Sig Start Date End Date Taking? Authorizing Provider  methocarbamol (ROBAXIN) 500 MG tablet Take 1 tablet (500 mg total) by mouth 2 (two) times  daily. 10/03/21  Yes Muzammil Bruins S, PA  albuterol (PROVENTIL HFA;VENTOLIN HFA) 108 (90 Base) MCG/ACT inhaler Inhale 1-2 puffs into the lungs every 6 (six) hours as needed. 11/09/18   Nche, Charlene Brooke, NP  amLODipine (NORVASC) 5 MG tablet Take 1 tablet (5 mg total) by mouth daily. 07/12/21   Nche, Charlene Brooke, NP  cetirizine (ZYRTEC) 10 MG tablet Take 1 tablet (10 mg total) by mouth at bedtime. Patient taking differently: Take 10 mg by mouth as needed. 11/09/18   Nche, Charlene Brooke, NP  ELDERBERRY PO Take 1 tablet by mouth daily.    [provider]  Ferrous Sulfate (IRON) 325 (65 Fe) MG TABS Take 1 tablet (325 mg total) by mouth 3 (three) times daily after meals. 01/24/20   Nche, Charlene Brooke, NP  fluticasone (FLONASE) 50 MCG/ACT nasal spray Place 2 sprays into both nostrils daily. Patient taking differently: Place 2 sprays into both nostrils as needed. 08/11/17   Ivar Drape D, PA  Multiple Vitamin (MULTIVITAMIN) capsule Take 1 capsule by mouth daily.    [provider]    Allergies    Shellfish allergy  Review of Systems   Review of Systems  Constitutional:  Negative for chills and fever.  HENT:  Negative for congestion.   Eyes:  Negative for pain.  Respiratory:  Negative for cough and shortness of breath.   Cardiovascular:  Positive for chest pain. Negative for leg swelling.  Gastrointestinal:  Negative for abdominal pain and vomiting.  Genitourinary:  Negative for dysuria.  Musculoskeletal:  Negative for myalgias.  Skin:  Negative for rash.  Neurological:  Negative for dizziness and headaches.   Physical Exam Updated Vital Signs BP (!) 181/115   Pulse 73   Temp 97.9 F (36.6 C) (Oral)   Resp 15   Ht 5\' 3"  (1.6 m)   Wt 72.6 kg   LMP 09/26/2021   SpO2 100%   BMI 28.34 kg/m   Physical Exam Vitals and nursing note reviewed.  Constitutional:      General: She is not in acute distress.    Comments: Pleasant well-appearing 45 year old.  In no  acute distress.  Sitting comfortably in bed.  Able answer questions appropriately follow commands. No increased work of breathing. Speaking in full sentences.   HENT:     Head: Normocephalic and atraumatic.     Nose: Nose normal.  Eyes:     General: No scleral icterus. Cardiovascular:     Rate and Rhythm: Normal rate and regular rhythm.     Pulses: Normal  pulses.     Heart sounds: Normal heart sounds.     Comments: Symmetric 3+ bilateral radial artery pulses Pulmonary:     Effort: Pulmonary effort is normal. No respiratory distress.     Breath sounds: Normal breath sounds. No wheezing.     Comments: Questionable anterior chest wall tenderness to palpation. Chest:     Chest wall: Tenderness present.  Abdominal:     Palpations: Abdomen is soft.     Tenderness: There is no abdominal tenderness.  Musculoskeletal:     Cervical back: Normal range of motion.     Right lower leg: No edema.     Left lower leg: No edema.  Skin:    General: Skin is warm and dry.     Capillary Refill: Capillary refill takes less than 2 seconds.  Neurological:     Mental Status: She is alert. Mental status is at baseline.  Psychiatric:        Mood and Affect: Mood normal.        Behavior: Behavior normal.    ED Results / Procedures / Treatments   Labs (all labs ordered are listed, but only abnormal results are displayed) Labs Reviewed  BASIC METABOLIC PANEL - Abnormal; Notable for the following components:      Result Value   Sodium 134 (*)    Potassium 3.2 (*)    Calcium 8.7 (*)    All other components within normal limits  CBC - Abnormal; Notable for the following components:   RBC 3.86 (*)    All other components within normal limits  PREGNANCY, URINE  TROPONIN I (HIGH SENSITIVITY)    EKG EKG Interpretation  Date/Time:  Thursday October 03 2021 22:37:21 EST Ventricular Rate:  73 PR Interval:  152 QRS Duration: 72 QT Interval:  386 QTC Calculation: 426 R Axis:   36 Text  Interpretation: Sinus rhythm No significant change since last tracing Confirmed by Blanchie Dessert [id number]) on 10/03/2021 10:43:50 PM  Radiology DG Chest 2 View  Result Date: 10/03/2021 CLINICAL DATA:  Chest pain. EXAM: CHEST - 2 VIEW COMPARISON:  Chest x-ray 12/28/2018 FINDINGS: The heart size and mediastinal contours are within normal limits. Both lungs are clear. The visualized skeletal structures are unremarkable. IMPRESSION: No active cardiopulmonary disease. Electronically Signed   By: Ronney Asters M.D.   On: 10/03/2021 22:37    Procedures Procedures   Medications Ordered in ED Medications  ibuprofen (ADVIL) tablet 600 mg (600 mg Oral Given 10/03/21 2312)  methocarbamol (ROBAXIN) tablet 750 mg (750 mg Oral Given 10/03/21 2312)  hydrALAZINE (APRESOLINE) injection 10 mg (10 mg Intravenous Given 10/03/21 2343)    ED Course  I have reviewed the triage vital signs and the nursing notes.  Pertinent labs & imaging results that were available during my care of the patient were reviewed by me and considered in my medical decision making (see chart for details).    MDM Rules/Calculators/A&P  Patient is a 45 year old female With past medical history detailed in HPI presented to ER today with atypical chest pain  BMP mild hypokalemia otherwise.  CBC without leukocytosis or anemia  CXR unremarkable. Trop x1 WNLs.   Preg negative   BP elevated - hx of noncompliance but did take 5mg  amlodipine today. Will give hydral and reassess.  Likely MSK pain given worse with certain movements. PERC negative doubt PE. ACS unlikely very few RF for this and very atypical presentation.  No pain currrently awaiting second troponin. Sign out to Dr. Sedonia Small.  Anticipate DC home. Pt agreeable to plan.   Final Clinical Impression(s) / ED Diagnoses Final diagnoses:  Atypical chest pain  Hypertension, unspecified type    Rx / DC Orders ED Discharge Orders          Ordered    methocarbamol (ROBAXIN)  500 MG tablet  2 times daily        10/03/21 2348             Pati Gallo [place], [place] 10/03/21 2353    Maudie Flakes, MD 10/04/21 [phone]

## 2021-10-03 NOTE — ED Notes (Signed)
ED Provider at bedside. PA-c

## 2021-10-03 NOTE — ED Provider Notes (Signed)
  Provider Note MRN:  [id number]  Arrival date & time: 10/04/21    ED Course and Medical Decision Making  Assumed care from Dr. Maryan Rued at shift change.  Chest pain which is positional, favoring MSK, hypertensive but recent medication noncompliance.  EKG is reassuring, first troponin negative, awaiting second.  PERC negative.  Feeling better after muscle relaxer and NSAID, anticipating discharge if troponin negative upon repeat.  Procedures  Final Clinical Impressions(s) / ED Diagnoses     ICD-10-CM   1. Atypical chest pain  R07.89     2. Hypertension, unspecified type  I10       ED Discharge Orders          Ordered    methocarbamol (ROBAXIN) 500 MG tablet  2 times daily        10/03/21 2348              Discharge Instructions      Your work-up today was quite reassuring.  Please follow-up with your primary care provider.  You may always return to the emergency room for any new or concerning symptoms.  I have also prescribed you a muscle relaxer Robaxin that you had in the ER today. Please take your medications (amlodipine especially) as prescribed.      Barth Kirks. Sedonia Small, [place] [email or web address]    Maudie Flakes, MD 10/04/21 [phone]

## 2021-10-03 NOTE — Discharge Instructions (Addendum)
Your work-up today was quite reassuring.  Please follow-up with your primary care provider.  You may always return to the emergency room for any new or concerning symptoms.  I have also prescribed you a muscle relaxer Robaxin that you had in the ER today. Please take your medications (amlodipine especially) as prescribed.

## 2021-10-03 NOTE — ED Triage Notes (Signed)
C/o mid sternal chest pain @ 5 pm lasting 1 hr then returned x 20 mins , denies SOB

## 2021-10-04 LAB — TROPONIN I (HIGH SENSITIVITY): Troponin I (High Sensitivity): 5 ng/L (ref <18)

## 2023-01-27 ENCOUNTER — Encounter: Payer: Self-pay | Admitting: Hematology and Oncology

## 2023-11-16 ENCOUNTER — Encounter: Payer: Self-pay | Admitting: Hematology and Oncology
